# Patient Record
Sex: Male | Born: 1970 | Race: Black or African American | Hispanic: No | Marital: Single | Smoking: Current every day smoker
Health system: Southern US, Community
[De-identification: ages and names within clinical notes are randomized; demographics above are authoritative.]

## PROBLEM LIST (undated history)

## (undated) DIAGNOSIS — IMO0001 Reserved for inherently not codable concepts without codable children: Secondary | ICD-10-CM

## (undated) DIAGNOSIS — I219 Acute myocardial infarction, unspecified: Secondary | ICD-10-CM

## (undated) DIAGNOSIS — G4730 Sleep apnea, unspecified: Secondary | ICD-10-CM

## (undated) DIAGNOSIS — M1990 Unspecified osteoarthritis, unspecified site: Secondary | ICD-10-CM

## (undated) DIAGNOSIS — R519 Headache, unspecified: Secondary | ICD-10-CM

## (undated) DIAGNOSIS — E7800 Pure hypercholesterolemia, unspecified: Secondary | ICD-10-CM

## (undated) DIAGNOSIS — R51 Headache: Secondary | ICD-10-CM

## (undated) DIAGNOSIS — I10 Essential (primary) hypertension: Secondary | ICD-10-CM

## (undated) DIAGNOSIS — I509 Heart failure, unspecified: Secondary | ICD-10-CM

---

## 2000-03-26 ENCOUNTER — Emergency Department (HOSPITAL_COMMUNITY): Admission: EM | Admit: 2000-03-26 | Discharge: 2000-03-26 | Payer: Self-pay

## 2013-10-17 ENCOUNTER — Encounter (HOSPITAL_COMMUNITY): Payer: Self-pay | Admitting: Emergency Medicine

## 2013-10-17 ENCOUNTER — Emergency Department (HOSPITAL_COMMUNITY)
Admission: EM | Admit: 2013-10-17 | Discharge: 2013-10-17 | Disposition: A | Discharge status: Home / Self Care | Payer: BC Managed Care – PPO | Attending: Emergency Medicine | Admitting: Emergency Medicine

## 2013-10-17 DIAGNOSIS — M129 Arthropathy, unspecified: Secondary | ICD-10-CM | POA: Insufficient documentation

## 2013-10-17 DIAGNOSIS — Z791 Long term (current) use of non-steroidal anti-inflammatories (NSAID): Secondary | ICD-10-CM | POA: Insufficient documentation

## 2013-10-17 DIAGNOSIS — R55 Syncope and collapse: Secondary | ICD-10-CM | POA: Insufficient documentation

## 2013-10-17 DIAGNOSIS — I10 Essential (primary) hypertension: Secondary | ICD-10-CM

## 2013-10-17 DIAGNOSIS — Z862 Personal history of diseases of the blood and blood-forming organs and certain disorders involving the immune mechanism: Secondary | ICD-10-CM | POA: Insufficient documentation

## 2013-10-17 DIAGNOSIS — Z8639 Personal history of other endocrine, nutritional and metabolic disease: Secondary | ICD-10-CM | POA: Insufficient documentation

## 2013-10-17 DIAGNOSIS — Z79899 Other long term (current) drug therapy: Secondary | ICD-10-CM | POA: Insufficient documentation

## 2013-10-17 HISTORY — DX: Unspecified osteoarthritis, unspecified site: M19.90

## 2013-10-17 HISTORY — DX: Essential (primary) hypertension: I10

## 2013-10-17 HISTORY — DX: Pure hypercholesterolemia, unspecified: E78.00

## 2013-10-17 LAB — BASIC METABOLIC PANEL
Anion gap: 15 (ref 5–15)
BUN: 18 mg/dL (ref 6–23)
CO2: 27 mEq/L (ref 19–32)
Calcium: 9.5 mg/dL (ref 8.4–10.5)
Chloride: 101 mEq/L (ref 96–112)
Creatinine, Ser: 0.96 mg/dL (ref 0.50–1.35)
GFR calc non Af Amer: >90 mL/min (ref >90)
Glucose, Bld: 103 mg/dL — ABNORMAL HIGH (ref 70–99)
Potassium: 3.7 mEq/L (ref 3.7–5.3)
Sodium: 143 mEq/L (ref 137–147)

## 2013-10-17 LAB — TROPONIN I
Troponin I: <0.3 ng/mL (ref <0.30)
Troponin I: <0.3 ng/mL (ref <0.30)

## 2013-10-17 LAB — CBC
HEMATOCRIT: 44.7 % (ref 39.0–52.0)
Hemoglobin: 14.8 g/dL (ref 13.0–17.0)
MCH: 27.2 pg (ref 26.0–34.0)
MCHC: 33.1 g/dL (ref 30.0–36.0)
MCV: 82 fL (ref 78.0–100.0)
Platelets: 218 10*3/uL (ref 150–400)
RBC: 5.45 MIL/uL (ref 4.22–5.81)
RDW: 15.1 % (ref 11.5–15.5)
WBC: 8.2 10*3/uL (ref 4.0–10.5)

## 2013-10-17 MED ORDER — HYDROCHLOROTHIAZIDE 25 MG PO TABS: 25.0000 mg | ORAL_TABLET | Freq: Every day | ORAL | Status: DC

## 2013-10-17 MED ORDER — BENAZEPRIL HCL 20 MG PO TABS: 20.0000 mg | ORAL_TABLET | Freq: Every day | ORAL | Status: DC

## 2013-10-17 MED ORDER — AMLODIPINE BESYLATE 5 MG PO TABS
5.0000 mg | ORAL_TABLET | Freq: Once | ORAL | Status: AC
  Administered 2013-10-17: 5 mg via ORAL
  Filled 2013-10-17: qty 1

## 2013-10-17 MED ORDER — LABETALOL HCL 5 MG/ML IV SOLN
10.0000 mg | Freq: Once | INTRAVENOUS | Status: AC
  Administered 2013-10-17: 10 mg via INTRAVENOUS
  Filled 2013-10-17: qty 4

## 2013-10-17 MED ORDER — AMLODIPINE BESYLATE 10 MG PO TABS: 10.0000 mg | ORAL_TABLET | Freq: Every day | ORAL | Status: DC

## 2013-10-17 MED ORDER — HYDROCHLOROTHIAZIDE 25 MG PO TABS
25.0000 mg | ORAL_TABLET | Freq: Every day | ORAL | Status: DC
  Administered 2013-10-17: 25 mg via ORAL
  Filled 2013-10-17: qty 1

## 2013-10-17 MED ORDER — OXYCODONE-ACETAMINOPHEN 5-325 MG PO TABS
1.0000 | ORAL_TABLET | Freq: Once | ORAL | Status: AC
  Administered 2013-10-17: 1 via ORAL
  Filled 2013-10-17: qty 1

## 2013-10-17 NOTE — ED Notes (Signed)
Gave pt water 

## 2013-10-17 NOTE — ED Provider Notes (Signed)
CSN: [id number]     Arrival date & time 10/17/13  [id number] History   First MD Initiated Contact with Patient 10/17/13 [phone]     Chief Complaint  Patient presents with  . 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  He awoke and had coffee but did not have anything to eat.  He states this is typical for him.  He states he began feeling lightheaded and the next thing he knew he woke up and before standing around him.  As reported that he was slightly diaphoretic.  He denied chest pain shortness of breath.  He reported some lightheadedness.  He was found to have a blood pressure of 207 systolic.  His heart rate was 96 and regular per EMS.  He reports no chest pain or shortness of breath at this time.  No preceding palpitations.  He states he stands for long periods of time at the counter.  He states he always stands with his legs locked secondary to arthritis pain in his knees.  He has a history of hypertension and is taking blood pressure medication for this but he does not remember which blood pressure medications he takes.  He gives me a list of 4 and states he takes some of these.  No history of cardiac issues.  No recent illness.  No recent diarrhea.  Denies melena hematochezia   Past Medical History  Diagnosis Date  . Hypertension   . Arthritis   . Hypercholesteremia    History reviewed. No pertinent past surgical history. History reviewed. No pertinent family history. History  Substance Use Topics  . Smoking status: Never Smoker   . Smokeless tobacco: Not on file  . Alcohol Use: Yes     Comment: occa    Review of Systems  All other systems reviewed and are negative.     Allergies  Review of patient's allergies indicates no known allergies.  Home Medications   Prior to Admission medications   Medication Sig Start Date End Date Taking? Authorizing Provider  celecoxib (CELEBREX) 200 MG capsule Take 200 mg by mouth daily.    Yes Historical Provider, MD  ibuprofen (ADVIL,MOTRIN) 200 MG tablet Take 800 mg by mouth 2 (two) times daily as needed for mild pain.   Yes Historical Provider, MD  Multiple Vitamins-Minerals (MULTIVITAMIN PO) Take 1 tablet by mouth daily.   Yes Historical Provider, MD  traMADol (ULTRAM) 50 MG tablet Take 50 mg by mouth every 6 (six) hours as needed for moderate pain.   Yes Historical Provider, MD  amLODipine (NORVASC) 10 MG tablet Take 1 tablet (10 mg total) by mouth daily. 10/17/13   Lyanne CoKevin M Sammie Schermerhorn, MD  benazepril (LOTENSIN) 20 MG tablet Take 1 tablet (20 mg total) by mouth daily. 10/17/13   Lyanne CoKevin M Alnita Aybar, MD  hydrochlorothiazide (HYDRODIURIL) 25 MG tablet Take 1 tablet (25 mg total) by mouth daily. 10/17/13   Lyanne CoKevin M Kemonie Cutillo, MD   BP 178/127  Pulse 89  Temp(Src) 98.1 F (36.7 C) (Oral)  Resp 23  Ht 5\' 11"  (1.803 m)  Wt 310 lb (140.615 kg)  BMI 43.26 kg/m2  SpO2 96% Physical Exam  Nursing note and vitals reviewed. Constitutional: He is oriented to person, place, and time. He appears well-developed and well-nourished.  HENT:  Head: Normocephalic and atraumatic.  Eyes: EOM are normal.  Neck: Normal range of motion.  Cardiovascular: Normal rate, regular rhythm, normal heart sounds and intact  distal pulses.   Pulmonary/Chest: Effort normal and breath sounds normal. No respiratory distress.  Abdominal: Soft. He exhibits no distension. There is no tenderness.  Musculoskeletal: Normal range of motion.  Neurological: He is alert and oriented to person, place, and time.  Skin: Skin is warm and dry.  Psychiatric: He has a normal mood and affect. Judgment normal.    ED Course  Procedures (including critical care time) Labs Review Labs Reviewed  BASIC METABOLIC PANEL - Abnormal; Notable for the following:    Glucose, Bld 103 (*)    All other components within normal limits  CBC  TROPONIN I  TROPONIN I    Imaging Review No results found.   EKG Interpretation   Date/Time:  Tuesday  October 17 2013 12:08:19 EDT Ventricular Rate:  88 PR Interval:  157 QRS Duration: 109 QT Interval:  418 QTC Calculation: 506 R Axis:   -49 Text Interpretation:  Sinus rhythm Probable left atrial enlargement  Incomplete RBBB and LAFB RSR' in V1 or V2, probably normal variant Left  ventricular hypertrophy with st channges consistent with LVH nd repol  abnormality No old tracing to compare Confirmed by Helina Hullum  MD, Caryn BeeKEVIN  ([id number]) on 10/17/2013 3:34:54 PM      MDM   Final diagnoses:  Syncope, unspecified syncope type  Essential hypertension    Hypertensive on arrival.  Initially I tried to lie this compounds on but it did not.  Patient was treated with oral and IV medication with improvement in his blood pressure.  He continues to feel fine at this time unlikely to home.  Troponin x2 negative.  EKG x2 without acute injury pattern.  He does have nonspecific repolarization abnormalities.  He has no prior EKG these appear to be recall abnormalities in with 2 negative troponins unchanged EKG from his one earlier in the ER visit I do not believe this is an acute coronary event.  Discharge home on Norvasc, Lotensin, HCTZ.  ECT followup is scheduled for 2 weeks from now.  The patient will record his blood pressures twice daily until then.  He understands to return to the ER for new or worsening symptoms    Lyanne CoKevin M Zophia Marrone, MD 10/17/13 1710

## 2013-10-17 NOTE — ED Notes (Signed)
Pt unhooked so he could amb to the bathroom for a BM.

## 2013-10-17 NOTE — ED Notes (Signed)
Per [facility], pt was at work and about 0815 started having a HA and became dizzy and diaphoretic. Upon arrival of EMS, pt was warm and dry, denied pain and was still a little lightheaded. 207 systolic. Unable to get diastolic reading. HR 96 and regular.

## 2013-10-17 NOTE — ED Notes (Addendum)
NAD noted. No pain noted. IV taken out. Pt given discharge instructions. All questions answered. Prescriptions reviewed. Pt ambulatory on discharge.

## 2013-10-17 NOTE — Discharge Instructions (Signed)
Hypertension Hypertension, commonly called high blood pressure, is when the force of blood pumping through your arteries is too strong. Your arteries are the blood vessels that carry blood from your heart throughout your body. A blood pressure reading consists of a higher number over a lower number, such as 110/72. The higher number (systolic) is the pressure inside your arteries when your heart pumps. The lower number (diastolic) is the pressure inside your arteries when your heart relaxes. Ideally you want your blood pressure below 120/80. Hypertension forces your heart to work harder to pump blood. Your arteries may become narrow or stiff. Having hypertension puts you at risk for heart disease, stroke, and other problems.  RISK FACTORS Some risk factors for high blood pressure are controllable. Others are not.  Risk factors you cannot control include:   Race. You may be at higher risk if you are African American.  Age. Risk increases with age.  Gender. Men are at higher risk than women before age 45 years. After age 65, women are at higher risk than men. Risk factors you can control include:  Not getting enough exercise or physical activity.  Being overweight.  Getting too much fat, sugar, calories, or salt in your diet.  Drinking too much alcohol. SIGNS AND SYMPTOMS Hypertension does not usually cause signs or symptoms. Extremely high blood pressure (hypertensive crisis) may cause headache, anxiety, shortness of breath, and nosebleed. DIAGNOSIS  To check if you have hypertension, your health care provider will measure your blood pressure while you are seated, with your arm held at the level of your heart. It should be measured at least twice using the same arm. Certain conditions can cause a difference in blood pressure between your right and left arms. A blood pressure reading that is higher than normal on one occasion does not mean that you need treatment. If one blood pressure reading  is high, ask your health care provider about having it checked again. TREATMENT  Treating high blood pressure includes making lifestyle changes and possibly taking medication. Living a healthy lifestyle can help lower high blood pressure. You may need to change some of your habits. Lifestyle changes may include:  Following the DASH diet. This diet is high in fruits, vegetables, and whole grains. It is low in salt, red meat, and added sugars.  Getting at least 2 1/2 hours of brisk physical activity every week.  Losing weight if necessary.  Not smoking.  Limiting alcoholic beverages.  Learning ways to reduce stress. If lifestyle changes are not enough to get your blood pressure under control, your health care provider may prescribe medicine. You may need to take more than one. Work closely with your health care provider to understand the risks and benefits. HOME CARE INSTRUCTIONS  Have your blood pressure rechecked as directed by your health care provider.   Only take medicine as directed by your health care provider. Follow the directions carefully. Blood pressure medicines must be taken as prescribed. The medicine does not work as well when you skip doses. Skipping doses also puts you at risk for problems.   Do not smoke.   Monitor your blood pressure at home as directed by your health care provider. SEEK MEDICAL CARE IF:   You think you are having a reaction to medicines taken.  You have recurrent headaches or feel dizzy.  You have swelling in your ankles.  You have trouble with your vision. SEEK IMMEDIATE MEDICAL CARE IF:  You develop a severe headache or   confusion.  You have unusual weakness, numbness, or feel faint.  You have severe chest or abdominal pain.  You vomit repeatedly.  You have trouble breathing. MAKE SURE YOU:   Understand these instructions.  Will watch your condition.  Will get help right away if you are not doing well or get  worse. Document Released: 03/30/2005 Document Revised: 04/04/2013 Document Reviewed: 01/20/2013 ExitCare Patient Information 2015 [employer]. This information is not intended to replace advice given to you by your health care provider. Make sure you discuss any questions you have with your health care provider. DASH Eating Plan DASH stands for "Dietary Approaches to Stop Hypertension." The DASH eating plan is a healthy eating plan that has been shown to reduce high blood pressure (hypertension). Additional health benefits may include reducing the risk of type 2 diabetes mellitus, heart disease, and stroke. The DASH eating plan may also help with weight loss. WHAT DO I NEED TO KNOW ABOUT THE DASH EATING PLAN? For the DASH eating plan, you will follow these general guidelines:  Choose foods with a percent daily value for sodium of less than 5% (as listed on the food label).  Use salt-free seasonings or herbs instead of table salt or sea salt.  Check with your health care provider or pharmacist before using salt substitutes.  Eat lower-sodium products, often labeled as "lower sodium" or "no salt added."  Eat fresh foods.  Eat more vegetables, fruits, and low-fat dairy products.  Choose whole grains. Look for the word "whole" as the first word in the ingredient list.  Choose fish and skinless chicken or turkey more often than red meat. Limit fish, poultry, and meat to 6 oz (170 g) each day.  Limit sweets, desserts, sugars, and sugary drinks.  Choose heart-healthy fats.  Limit cheese to 1 oz (28 g) per day.  Eat more home-cooked food and less restaurant, buffet, and fast food.  Limit fried foods.  Cook foods using methods other than frying.  Limit canned vegetables. If you do use them, rinse them well to decrease the sodium.  When eating at a restaurant, ask that your food be prepared with less salt, or no salt if possible. WHAT FOODS CAN I EAT? Seek help from a dietitian for  individual calorie needs. Grains Whole grain or whole wheat bread. Brown rice. Whole grain or whole wheat pasta. Quinoa, bulgur, and whole grain cereals. Low-sodium cereals. Corn or whole wheat flour tortillas. Whole grain cornbread. Whole grain crackers. Low-sodium crackers. Vegetables Fresh or frozen vegetables (raw, steamed, roasted, or grilled). Low-sodium or reduced-sodium tomato and vegetable juices. Low-sodium or reduced-sodium tomato sauce and paste. Low-sodium or reduced-sodium canned vegetables.  Fruits All fresh, canned (in natural juice), or frozen fruits. Meat and Other Protein Products Ground beef (85% or leaner), grass-fed beef, or beef trimmed of fat. Skinless chicken or turkey. Ground chicken or turkey. Pork trimmed of fat. All fish and seafood. Eggs. Dried beans, peas, or lentils. Unsalted nuts and seeds. Unsalted canned beans. Dairy Low-fat dairy products, such as skim or 1% milk, 2% or reduced-fat cheeses, low-fat ricotta or cottage cheese, or plain low-fat yogurt. Low-sodium or reduced-sodium cheeses. Fats and Oils Tub margarines without trans fats. Light or reduced-fat mayonnaise and salad dressings (reduced sodium). Avocado. Safflower, olive, or canola oils. Natural peanut or almond butter. Other Unsalted popcorn and pretzels. The items listed above may not be a complete list of recommended foods or beverages. Contact your dietitian for more options. WHAT FOODS ARE NOT RECOMMENDED? Grains   White bread. White pasta. White rice. Refined cornbread. Bagels and croissants. Crackers that contain trans fat. Vegetables Creamed or fried vegetables. Vegetables in a cheese sauce. Regular canned vegetables. Regular canned tomato sauce and paste. Regular tomato and vegetable juices. Fruits Dried fruits. Canned fruit in light or heavy syrup. Fruit juice. Meat and Other Protein Products Fatty cuts of meat. Ribs, chicken wings, bacon, sausage, bologna, salami, chitterlings, fatback, hot  dogs, bratwurst, and packaged luncheon meats. Salted nuts and seeds. Canned beans with salt. Dairy Whole or 2% milk, cream, half-and-half, and cream cheese. Whole-fat or sweetened yogurt. Full-fat cheeses or blue cheese. Nondairy creamers and whipped toppings. Processed cheese, cheese spreads, or cheese curds. Condiments Onion and garlic salt, seasoned salt, table salt, and sea salt. Canned and packaged gravies. Worcestershire sauce. Tartar sauce. Barbecue sauce. Teriyaki sauce. Soy sauce, including reduced sodium. Steak sauce. Fish sauce. Oyster sauce. Cocktail sauce. Horseradish. Ketchup and mustard. Meat flavorings and tenderizers. Bouillon cubes. Hot sauce. Tabasco sauce. Marinades. Taco seasonings. Relishes. Fats and Oils Butter, stick margarine, lard, shortening, ghee, and bacon fat. Coconut, palm kernel, or palm oils. Regular salad dressings. Other Pickles and olives. Salted popcorn and pretzels. The items listed above may not be a complete list of foods and beverages to avoid. Contact your dietitian for more information. WHERE CAN I FIND MORE INFORMATION? National Heart, Lung, and Blood Institute: www.nhlbi.nih.gov/health/health-topics/topics/dash/ Document Released: 03/19/2011 Document Revised: 04/04/2013 Document Reviewed: 02/01/2013 ExitCare Patient Information 2015 [employer]. This information is not intended to replace advice given to you by your health care provider. Make sure you discuss any questions you have with your health care provider.  

## 2013-10-17 NOTE — ED Notes (Signed)
Pt denies HA now. Pt states he feels pretty much back to normal. He feels slightly SOB but states he has had that before when he had a panic attack. Pt is neurally intact. Pt also states he coworkers told him he passed out briefly in a chair and was diaphoretic. This happened after his HA started and he was dizzy. Pt does not remember passing out.

## 2014-03-25 ENCOUNTER — Encounter (HOSPITAL_COMMUNITY): Payer: Self-pay | Admitting: Emergency Medicine

## 2014-03-25 ENCOUNTER — Emergency Department (INDEPENDENT_AMBULATORY_CARE_PROVIDER_SITE_OTHER)
Admission: EM | Admit: 2014-03-25 | Discharge: 2014-03-25 | Disposition: A | Discharge status: Home / Self Care | Payer: Self-pay | Source: Home / Self Care | Attending: Family Medicine | Admitting: Family Medicine

## 2014-03-25 DIAGNOSIS — I10 Essential (primary) hypertension: Secondary | ICD-10-CM

## 2014-03-25 LAB — POCT I-STAT, CHEM 8
BUN: 15 mg/dL (ref 6–23)
Calcium, Ion: 1.15 mmol/L (ref 1.12–1.23)
Chloride: 100 mEq/L (ref 96–112)
Creatinine, Ser: 1.2 mg/dL (ref 0.50–1.35)
GLUCOSE: 89 mg/dL (ref 70–99)
HEMATOCRIT: 51 % (ref 39.0–52.0)
HEMOGLOBIN: 17.3 g/dL — AB (ref 13.0–17.0)
POTASSIUM: 3.4 meq/L — AB (ref 3.7–5.3)
Sodium: 141 mEq/L (ref 137–147)
TCO2: 28 mmol/L (ref 0–100)

## 2014-03-25 MED ORDER — AMLODIPINE BESYLATE 10 MG PO TABS: 10.0000 mg | ORAL_TABLET | Freq: Every day | ORAL | Status: DC

## 2014-03-25 MED ORDER — LOSARTAN POTASSIUM 100 MG PO TABS: 100.0000 mg | ORAL_TABLET | Freq: Every day | ORAL | Status: DC

## 2014-03-25 NOTE — Discharge Instructions (Signed)
Take medicine as prescribed, see your doctor in 1 week for recheck.

## 2014-03-25 NOTE — ED Notes (Signed)
Pt states that he has been out of HTN medication since September/october. Pt states that he lost his job and has no [employer], [employer] insurance

## 2014-03-25 NOTE — ED Provider Notes (Signed)
CSN: [id number]     Arrival date & time 03/25/14  1757 History   First MD Initiated Contact with Patient 03/25/14 1832     Chief Complaint  Patient presents with  . Medication Refill   (Consider location/radiation/quality/duration/timing/severity/associated sxs/prior Treatment) Patient is a 43 y.o. male presenting with hypertension. The history is provided by the patient.  Hypertension This is a chronic problem. Episode onset: out of bp meds since end of sept, h/o mi, no job , no money. The problem has not changed since onset.Pertinent negatives include no chest pain, no abdominal pain and no shortness of breath.    Past Medical History  Diagnosis Date  . Hypertension   . Arthritis   . Hypercholesteremia    History reviewed. No pertinent past surgical history. History reviewed. No pertinent family history. History  Substance Use Topics  . Smoking status: Never Smoker   . Smokeless tobacco: Not on file  . Alcohol Use: Yes     Comment: occa    Review of Systems  Constitutional: Negative.   Respiratory: Negative.  Negative for shortness of breath.   Cardiovascular: Positive for leg swelling. Negative for chest pain and palpitations.  Gastrointestinal: Negative.  Negative for abdominal pain.    Allergies  Review of patient's allergies indicates no known allergies.  Home Medications   Prior to Admission medications   Medication Sig Start Date End Date Taking? Authorizing Provider  amLODipine (NORVASC) 10 MG tablet Take 1 tablet (10 mg total) by mouth daily. 10/17/13   Lyanne CoKevin M Campos, MD  amLODipine (NORVASC) 10 MG tablet Take 1 tablet (10 mg total) by mouth daily. 03/25/14   Linna HoffJames D Zyrion Coey, MD  benazepril (LOTENSIN) 20 MG tablet Take 1 tablet (20 mg total) by mouth daily. 10/17/13   Lyanne CoKevin M Campos, MD  celecoxib (CELEBREX) 200 MG capsule Take 200 mg by mouth daily.    Historical Provider, MD  hydrochlorothiazide (HYDRODIURIL) 25 MG tablet Take 1 tablet (25 mg total) by mouth  daily. 10/17/13   Lyanne CoKevin M Campos, MD  ibuprofen (ADVIL,MOTRIN) 200 MG tablet Take 800 mg by mouth 2 (two) times daily as needed for mild pain.    Historical Provider, MD  losartan (COZAAR) 100 MG tablet Take 1 tablet (100 mg total) by mouth daily. 03/25/14   Linna HoffJames D Renji Berwick, MD  Multiple Vitamins-Minerals (MULTIVITAMIN PO) Take 1 tablet by mouth daily.    Historical Provider, MD  traMADol (ULTRAM) 50 MG tablet Take 50 mg by mouth every 6 (six) hours as needed for moderate pain.    Historical Provider, MD   BP 180/115 mmHg  Pulse 92  Temp(Src) 99.9 F (37.7 C) (Oral)  Resp 18  SpO2 95% Physical Exam  Constitutional: He is oriented to person, place, and time. He appears well-developed and well-nourished. No distress.  Neck: Normal range of motion. Neck supple.  Cardiovascular: Normal rate, regular rhythm, normal heart sounds and intact distal pulses.   Pulmonary/Chest: Effort normal and breath sounds normal. No respiratory distress.  Musculoskeletal: He exhibits edema.  Neurological: He is alert and oriented to person, place, and time.  Skin: Skin is warm and dry.  Nursing note and vitals reviewed.   ED Course  Procedures (including critical care time) Labs Review Labs Reviewed  POCT I-STAT, CHEM 8 - Abnormal; Notable for the following:    Potassium 3.4 (*)    Hemoglobin 17.3 (*)    All other components within normal limits   i-stat as noted. Imaging Review No results found.  MDM   1. Essential hypertension        Linna HoffJames D Marshella Tello, MD 03/26/14 [phone]

## 2014-06-04 ENCOUNTER — Emergency Department (HOSPITAL_COMMUNITY): Payer: Self-pay

## 2014-06-04 ENCOUNTER — Encounter (HOSPITAL_COMMUNITY): Payer: Self-pay | Admitting: *Deleted

## 2014-06-04 ENCOUNTER — Inpatient Hospital Stay (HOSPITAL_COMMUNITY)
Admission: EM | Admit: 2014-06-04 | Discharge: 2014-06-06 | DRG: 292 | Disposition: A | Discharge status: Home / Self Care | Payer: Self-pay | Attending: Internal Medicine | Admitting: Internal Medicine

## 2014-06-04 ENCOUNTER — Emergency Department (INDEPENDENT_AMBULATORY_CARE_PROVIDER_SITE_OTHER)
Admission: EM | Admit: 2014-06-04 | Discharge: 2014-06-04 | Disposition: A | Discharge status: Nursing Home (Includes ICF) | Payer: Self-pay | Source: Home / Self Care | Attending: Emergency Medicine | Admitting: Emergency Medicine

## 2014-06-04 DIAGNOSIS — R600 Localized edema: Secondary | ICD-10-CM | POA: Diagnosis present

## 2014-06-04 DIAGNOSIS — Z8249 Family history of ischemic heart disease and other diseases of the circulatory system: Secondary | ICD-10-CM

## 2014-06-04 DIAGNOSIS — I2489 Other forms of acute ischemic heart disease: Secondary | ICD-10-CM | POA: Insufficient documentation

## 2014-06-04 DIAGNOSIS — E780 Pure hypercholesterolemia: Secondary | ICD-10-CM | POA: Diagnosis present

## 2014-06-04 DIAGNOSIS — I43 Cardiomyopathy in diseases classified elsewhere: Secondary | ICD-10-CM | POA: Diagnosis present

## 2014-06-04 DIAGNOSIS — I160 Hypertensive urgency: Secondary | ICD-10-CM | POA: Diagnosis present

## 2014-06-04 DIAGNOSIS — R079 Chest pain, unspecified: Secondary | ICD-10-CM

## 2014-06-04 DIAGNOSIS — R0602 Shortness of breath: Secondary | ICD-10-CM

## 2014-06-04 DIAGNOSIS — Z87891 Personal history of nicotine dependence: Secondary | ICD-10-CM

## 2014-06-04 DIAGNOSIS — I10 Essential (primary) hypertension: Secondary | ICD-10-CM

## 2014-06-04 DIAGNOSIS — R0609 Other forms of dyspnea: Secondary | ICD-10-CM | POA: Diagnosis present

## 2014-06-04 DIAGNOSIS — I110 Hypertensive heart disease with heart failure: Principal | ICD-10-CM | POA: Diagnosis present

## 2014-06-04 DIAGNOSIS — R7989 Other specified abnormal findings of blood chemistry: Secondary | ICD-10-CM | POA: Diagnosis present

## 2014-06-04 DIAGNOSIS — E669 Obesity, unspecified: Secondary | ICD-10-CM | POA: Diagnosis present

## 2014-06-04 DIAGNOSIS — Z79899 Other long term (current) drug therapy: Secondary | ICD-10-CM

## 2014-06-04 DIAGNOSIS — I5043 Acute on chronic combined systolic (congestive) and diastolic (congestive) heart failure: Secondary | ICD-10-CM | POA: Diagnosis present

## 2014-06-04 DIAGNOSIS — E876 Hypokalemia: Secondary | ICD-10-CM | POA: Diagnosis present

## 2014-06-04 DIAGNOSIS — I248 Other forms of acute ischemic heart disease: Secondary | ICD-10-CM | POA: Diagnosis present

## 2014-06-04 DIAGNOSIS — R778 Other specified abnormalities of plasma proteins: Secondary | ICD-10-CM | POA: Diagnosis present

## 2014-06-04 DIAGNOSIS — I509 Heart failure, unspecified: Secondary | ICD-10-CM

## 2014-06-04 DIAGNOSIS — I119 Hypertensive heart disease without heart failure: Secondary | ICD-10-CM | POA: Insufficient documentation

## 2014-06-04 DIAGNOSIS — R9431 Abnormal electrocardiogram [ECG] [EKG]: Secondary | ICD-10-CM

## 2014-06-04 DIAGNOSIS — M1990 Unspecified osteoarthritis, unspecified site: Secondary | ICD-10-CM | POA: Diagnosis present

## 2014-06-04 HISTORY — DX: Sleep apnea, unspecified: G47.30

## 2014-06-04 HISTORY — DX: Headache, unspecified: R51.9

## 2014-06-04 HISTORY — DX: Heart failure, unspecified: I50.9

## 2014-06-04 HISTORY — DX: Acute myocardial infarction, unspecified: I21.9

## 2014-06-04 HISTORY — DX: Reserved for inherently not codable concepts without codable children: IMO0001

## 2014-06-04 HISTORY — DX: Headache: R51

## 2014-06-04 LAB — BASIC METABOLIC PANEL
Anion gap: 11 (ref 5–15)
BUN: 13 mg/dL (ref 6–23)
CHLORIDE: 105 mmol/L (ref 96–112)
CO2: 25 mmol/L (ref 19–32)
Calcium: 8.8 mg/dL (ref 8.4–10.5)
Creatinine, Ser: 0.89 mg/dL (ref 0.50–1.35)
GFR calc Af Amer: >90 mL/min (ref >90)
GFR calc non Af Amer: >90 mL/min (ref >90)
GLUCOSE: 116 mg/dL — AB (ref 70–99)
Potassium: 3.1 mmol/L — ABNORMAL LOW (ref 3.5–5.1)
SODIUM: 141 mmol/L (ref 135–145)

## 2014-06-04 LAB — PROTIME-INR
INR: 1.07 (ref 0.00–1.49)
PROTHROMBIN TIME: 14 s (ref 11.6–15.2)

## 2014-06-04 LAB — TROPONIN I
TROPONIN I: 0.06 ng/mL — AB (ref <0.031)
TROPONIN I: 0.06 ng/mL — AB (ref <0.031)

## 2014-06-04 LAB — CBC WITH DIFFERENTIAL/PLATELET
Basophils Absolute: 0 10*3/uL (ref 0.0–0.1)
Basophils Relative: 0 % (ref 0–1)
Eosinophils Absolute: 0.1 10*3/uL (ref 0.0–0.7)
Eosinophils Relative: 1 % (ref 0–5)
HCT: 41.2 % (ref 39.0–52.0)
Hemoglobin: 13.4 g/dL (ref 13.0–17.0)
Lymphocytes Relative: 19 % (ref 12–46)
Lymphs Abs: 1.4 10*3/uL (ref 0.7–4.0)
MCH: 25.8 pg — ABNORMAL LOW (ref 26.0–34.0)
MCHC: 32.5 g/dL (ref 30.0–36.0)
MCV: 79.4 fL (ref 78.0–100.0)
Monocytes Absolute: 0.5 10*3/uL (ref 0.1–1.0)
Monocytes Relative: 6 % (ref 3–12)
NEUTROS ABS: 5.2 10*3/uL (ref 1.7–7.7)
Neutrophils Relative %: 74 % (ref 43–77)
PLATELETS: 241 10*3/uL (ref 150–400)
RBC: 5.19 MIL/uL (ref 4.22–5.81)
RDW: 15.3 % (ref 11.5–15.5)
WBC: 7.1 10*3/uL (ref 4.0–10.5)

## 2014-06-04 LAB — BRAIN NATRIURETIC PEPTIDE: B Natriuretic Peptide: 409.2 pg/mL — ABNORMAL HIGH (ref 0.0–100.0)

## 2014-06-04 LAB — TSH: TSH: 1.521 u[IU]/mL (ref 0.350–4.500)

## 2014-06-04 MED ORDER — HYDROCODONE-ACETAMINOPHEN 5-325 MG PO TABS: 1.0000 | ORAL_TABLET | ORAL | Status: DC | PRN

## 2014-06-04 MED ORDER — BENAZEPRIL HCL 20 MG PO TABS
20.0000 mg | ORAL_TABLET | Freq: Every day | ORAL | Status: DC
  Administered 2014-06-04: 20 mg via ORAL
  Filled 2014-06-04: qty 1

## 2014-06-04 MED ORDER — CARVEDILOL 3.125 MG PO TABS
3.1250 mg | ORAL_TABLET | Freq: Two times a day (BID) | ORAL | Status: DC
  Administered 2014-06-04 – 2014-06-05 (×3): 3.125 mg via ORAL
  Filled 2014-06-04 (×6): qty 1

## 2014-06-04 MED ORDER — HEPARIN SODIUM (PORCINE) 5000 UNIT/ML IJ SOLN
5000.0000 [IU] | Freq: Three times a day (TID) | INTRAMUSCULAR | Status: DC
  Administered 2014-06-04 – 2014-06-06 (×5): 5000 [IU] via SUBCUTANEOUS
  Filled 2014-06-04 (×8): qty 1

## 2014-06-04 MED ORDER — LABETALOL HCL 5 MG/ML IV SOLN
20.0000 mg | Freq: Once | INTRAVENOUS | Status: AC
  Administered 2014-06-04: 20 mg via INTRAVENOUS
  Filled 2014-06-04: qty 4

## 2014-06-04 MED ORDER — HYDROCHLOROTHIAZIDE 25 MG PO TABS: 25.0000 mg | ORAL_TABLET | Freq: Once | ORAL | Status: DC

## 2014-06-04 MED ORDER — AMLODIPINE BESYLATE 10 MG PO TABS
10.0000 mg | ORAL_TABLET | Freq: Every day | ORAL | Status: DC
  Administered 2014-06-05: 10 mg via ORAL
  Filled 2014-06-04 (×2): qty 1

## 2014-06-04 MED ORDER — HYDRALAZINE HCL 20 MG/ML IJ SOLN
10.0000 mg | Freq: Four times a day (QID) | INTRAMUSCULAR | Status: DC | PRN
  Administered 2014-06-05: 10 mg via INTRAVENOUS
  Filled 2014-06-04 (×2): qty 1

## 2014-06-04 MED ORDER — POTASSIUM CHLORIDE CRYS ER 20 MEQ PO TBCR
60.0000 meq | EXTENDED_RELEASE_TABLET | Freq: Four times a day (QID) | ORAL | Status: AC
  Administered 2014-06-05: 60 meq via ORAL
  Filled 2014-06-04: qty 3

## 2014-06-04 MED ORDER — ACETAMINOPHEN 650 MG RE SUPP: 650.0000 mg | Freq: Four times a day (QID) | RECTAL | Status: DC | PRN

## 2014-06-04 MED ORDER — MORPHINE SULFATE 4 MG/ML IJ SOLN: 4.0000 mg | INTRAMUSCULAR | Status: DC | PRN

## 2014-06-04 MED ORDER — AMLODIPINE BESYLATE 5 MG PO TABS
10.0000 mg | ORAL_TABLET | Freq: Once | ORAL | Status: AC
  Administered 2014-06-04: 10 mg via ORAL
  Filled 2014-06-04: qty 2

## 2014-06-04 MED ORDER — FUROSEMIDE 10 MG/ML IJ SOLN
40.0000 mg | Freq: Two times a day (BID) | INTRAMUSCULAR | Status: DC
  Administered 2014-06-05 – 2014-06-06 (×3): 40 mg via INTRAVENOUS
  Filled 2014-06-04 (×5): qty 4

## 2014-06-04 MED ORDER — SODIUM CHLORIDE 0.9 % IJ SOLN
3.0000 mL | Freq: Two times a day (BID) | INTRAMUSCULAR | Status: DC
  Administered 2014-06-04 – 2014-06-06 (×4): 3 mL via INTRAVENOUS

## 2014-06-04 MED ORDER — MORPHINE SULFATE 2 MG/ML IJ SOLN: 1.0000 mg | INTRAMUSCULAR | Status: DC | PRN

## 2014-06-04 MED ORDER — ACETAMINOPHEN 325 MG PO TABS
650.0000 mg | ORAL_TABLET | Freq: Four times a day (QID) | ORAL | Status: DC | PRN
  Administered 2014-06-05: 650 mg via ORAL
  Filled 2014-06-04: qty 2

## 2014-06-04 MED ORDER — SODIUM CHLORIDE 0.9 % IV SOLN
Freq: Once | INTRAVENOUS | Status: AC
  Administered 2014-06-04: 11:00:00 via INTRAVENOUS

## 2014-06-04 NOTE — ED Provider Notes (Signed)
CSN: [id number]     Arrival date & time 06/04/14  1150 History   First Livingston Initiated Contact with Patient 06/04/14 1155     Chief Complaint  Patient presents with  . Hypertension  . Shortness of Breath     (Consider location/radiation/quality/duration/timing/severity/associated sxs/prior Treatment) HPI   PCP: NO PCP pt reports recently relocated from [place] ( I do see visits in the EMR from July 2015). Blood pressure 210/140, pulse 93, temperature 97.5 F (36.4 C), temperature source Oral, resp. rate 25, SpO2 97 %.  Scott Livingston is a 44 y.o.male with a significant PMH of hypertension, arthritis, hypercholesteremia  presents to the ER sent by [facility] from the Urgent Care. He reports going to the [facility] this morning to establish a PCP as he has been out of his hypertensive medications for the past week. They recommended he go to the UC. The patient has had lower extremity swelling, orthopnea, DOE, and uncontrolled hypertension. He denies any CP or SOB at rest. He takes Lotensin and Norvasc at home usually. Denies headache, neck pain, change in vision, generalized or focal weakness, confusion, CP, SOB at rest, cough, hematuria or any other associated symptoms. In Triage his BP is 210/140, in the room the most current read is 190/ 120.  Past Medical History  Diagnosis Date  . Hypertension   . Arthritis   . Hypercholesteremia    No past surgical history on file. No family history on file. History  Substance Use Topics  . Smoking status: Former Games developer  . Smokeless tobacco: Not on file  . Alcohol Use: Yes     Comment: occa    Review of Systems  10 Systems reviewed and are negative for acute change except as noted in the HPI.    Allergies  Review of patient's allergies indicates no known allergies.  Home Medications   Prior to Admission medications   Medication Sig Start Date End Date Taking? Authorizing Provider  amLODipine (NORVASC) 10 MG tablet Take 1 tablet  (10 mg total) by mouth daily. 10/17/13   Scott Livingston  amLODipine (NORVASC) 10 MG tablet Take 1 tablet (10 mg total) by mouth daily. 03/25/14   Scott Livingston  benazepril (LOTENSIN) 20 MG tablet Take 1 tablet (20 mg total) by mouth daily. 10/17/13   Scott Livingston  celecoxib (CELEBREX) 200 MG capsule Take 200 mg by mouth daily.    Historical Provider, Livingston  hydrochlorothiazide (HYDRODIURIL) 25 MG tablet Take 1 tablet (25 mg total) by mouth daily. 10/17/13   Scott Livingston  ibuprofen (ADVIL,MOTRIN) 200 MG tablet Take 800 mg by mouth 2 (two) times daily as needed for mild pain.    Historical Provider, Livingston  losartan (COZAAR) 100 MG tablet Take 1 tablet (100 mg total) by mouth daily. 03/25/14   Scott Livingston  Multiple Vitamins-Minerals (MULTIVITAMIN PO) Take 1 tablet by mouth daily.    Historical Provider, Livingston  traMADol (ULTRAM) 50 MG tablet Take 50 mg by mouth every 6 (six) hours as needed for moderate pain.    Historical Provider, Livingston   BP 210/140 mmHg  Pulse 93  Temp(Src) 97.5 F (36.4 C) (Oral)  Resp 25  SpO2 97% Physical Exam  Constitutional: He appears well-developed and well-nourished. No distress.  HENT:  Head: Normocephalic and atraumatic.  Eyes: Pupils are equal, round, and reactive to light.  Neck: Normal range of motion. Neck supple.  Cardiovascular: Normal rate and regular rhythm.  Pulmonary/Chest: Effort normal. He has no decreased breath sounds. He has no wheezes. He has rales (minimal at the lung bases).  Abdominal: Soft. Bowel sounds are normal. He exhibits no distension and no fluid wave. There is no tenderness. There is no rigidity.  Exam limited by omentum.   Musculoskeletal:  Bilateral lower extremity swelling. No pitting edema.  Neurological: He is alert.  Skin: Skin is warm and dry.  Nursing note and vitals reviewed.   ED Course  Procedures (including critical care time) Labs Review Labs Reviewed  CBC WITH DIFFERENTIAL/PLATELET  BRAIN NATRIURETIC  PEPTIDE  BASIC METABOLIC PANEL  TROPONIN I    Imaging Review No results found.   EKG Interpretation None      MDM   Final diagnoses:  Chest pain  Hypertension    Medications  benazepril (LOTENSIN) tablet 20 mg (20 mg Oral Given 06/04/14 1249)  labetalol (NORMODYNE,TRANDATE) injection 20 mg (not administered)  amLODipine (NORVASC) tablet 10 mg (10 mg Oral Given 06/04/14 1249)  labetalol (NORMODYNE,TRANDATE) injection 20 mg (20 mg Intravenous Given 06/04/14 1249)    Pt received 2 IV doses of labetolol and home dose of medications. His blood pressure improves but then rises again. He has had a mildly elevated Troponin. Mildly elevated BNP and his chest xray shows pulmonary vascular congestion. I feel that the patient will be best managed as inpatient. I have requested [facility] for admission, Scott Livingston has agreed to admit. Pt discussed with Scott Livingston. Case discussed with patient who understands the plan and is agreeable.  Filed Vitals:   06/04/14 1640  BP: 157/112  Pulse: 80  Temp:   Resp: [address]       Scott Gorby G Gage Treiber, PA-C 06/04/14 1704  Scott Matasiffany G Macel Yearsley, PA-C 06/04/14 1716  Scott MelterElliott L Wentz, Livingston 06/06/14 [phone]

## 2014-06-04 NOTE — ED Notes (Signed)
Attempted report to [address]. 

## 2014-06-04 NOTE — Progress Notes (Addendum)
   06/04/14 1745  Vitals  Temp 98.3 F (36.8 C)  Temp Source Oral  BP (!) 166/119 mmHg  BP Location Right Arm  BP Method Automatic  Patient Position (if appropriate) Sitting  Pulse Rate 74  ECG Heart Rate 74  Oxygen Therapy  SpO2 94 %  O2 Device Room Air  Height and Weight  Height 5\' 10"  (1.778 m)  Weight in (lb) to have BMI = 25 173.9  admission vitals.Larsen Dungan, Randall AnKristin Jessup RN

## 2014-06-04 NOTE — ED Notes (Signed)
2 weeks of exertional SOB, bilateral knee pain,  He reports he has run out of his BP meds 1 week ago.  He denies chest pain.  Skin w/d color good

## 2014-06-04 NOTE — ED Notes (Signed)
Pt arrives from Urgent Care via [facility]. Pt has c/o exertional SOB and HTN rt being out of BP medications for 1 week. Pt is currently not in any distress or having SOB.

## 2014-06-04 NOTE — H&P (Signed)
[facility] History and Physical  Scott Bundenthony Shumard WJX:[id number] DOB: 06/21/1970 DOA: 06/04/2014  Referring physician: EDP PCP: No PCP Per Patient   Chief Complaint: Elevated blood pressure  HPI: Scott Livingston is a 44 y.o. male with past medical history of hypertension, obesity and hypercholesterolemia, referred to the hospital from urgent care center because of high blood pressure. Patient has history of hypertension, appears to be poorly controlled. He reported for the past couple of weeks he was not taking his medications because he ran out of them. He went to see urgent care today and they referred him to the hospital because of very high blood pressure. Patient reported lower extremity edema, dyspnea on exertion, orthopnea and cough sometimes. In the ED his blood pressure was 210/140, chest x-ray showed vascular congestion, his BNP is 410 and slightly elevated troponin of 0.06, patient admitted to the hospital for further evaluation.   Review of Systems:  Constitutional: negative for anorexia, fevers and sweats Eyes: negative for irritation, redness and visual disturbance Ears, nose, mouth, throat, and face: negative for earaches, epistaxis, nasal congestion and sore throat Respiratory: negative for cough, dyspnea on exertion, sputum and wheezing Cardiovascular: Per history of present illness Gastrointestinal: negative for abdominal pain, constipation, diarrhea, melena, nausea and vomiting Genitourinary:negative for dysuria, frequency and hematuria Hematologic/lymphatic: negative for bleeding, easy bruising and lymphadenopathy Musculoskeletal:negative for arthralgias, muscle weakness and stiff joints Neurological: negative for coordination problems, gait problems, headaches and weakness Endocrine: negative for diabetic symptoms including polydipsia, polyuria and weight loss Allergic/Immunologic: negative for anaphylaxis, hay fever and urticaria  Past Medical History  Diagnosis  Date  . Hypertension   . Arthritis   . Hypercholesteremia    No past surgical history on file. Social History:   reports that he has quit smoking. He does not have any smokeless tobacco history on file. He reports that he drinks alcohol. He reports that he does not use illicit drugs.  No Known Allergies  Family history: Father has diabetes and mother has hypertension  Prior to Admission medications   Medication Sig Start Date End Date Taking? Authorizing Provider  amLODipine (NORVASC) 10 MG tablet Take 1 tablet (10 mg total) by mouth daily. 10/17/13  Yes Lyanne CoKevin M Campos, MD  benazepril (LOTENSIN) 20 MG tablet Take 1 tablet (20 mg total) by mouth daily. 10/17/13  Yes Lyanne CoKevin M Campos, MD  amLODipine (NORVASC) 10 MG tablet Take 1 tablet (10 mg total) by mouth daily. 03/25/14   Linna HoffJames D Kindl, MD  celecoxib (CELEBREX) 200 MG capsule Take 200 mg by mouth daily.    Historical Provider, MD  losartan (COZAAR) 100 MG tablet Take 1 tablet (100 mg total) by mouth daily. 03/25/14   Linna HoffJames D Kindl, MD  traMADol (ULTRAM) 50 MG tablet Take 50 mg by mouth every 6 (six) hours as needed for moderate pain.    Historical Provider, MD   Physical Exam: Filed Vitals:   06/04/14 1640  BP: 157/112  Pulse: 80  Temp:   Resp: 19   Constitutional: Oriented to person, place, and time.  Obese African-American male Head: Normocephalic and atraumatic.  Nose: Nose normal.  Mouth/Throat: Uvula is midline, oropharynx is clear and moist and mucous membranes are normal.  Eyes: Conjunctivae and EOM are normal. Pupils are equal, round, and reactive to light.  Neck: Trachea normal and normal range of motion. Neck supple.  Cardiovascular: Normal rate, regular rhythm, S1 normal, S2 normal, normal heart sounds and intact distal pulses.   Pulmonary/Chest: Effort normal and breath  sounds normal.  Abdominal: Soft. Bowel sounds are normal. There is no hepatosplenomegaly. There is no tenderness.  Musculoskeletal:  +2 bilateral pedal  edema  urological: Alert and oriented to person, place, and time. Has normal strength. No cranial nerve deficit or sensory deficit.  Skin: Skin is warm, dry and intact.  Psychiatric: Has a normal mood and affect. Speech is normal and behavior is normal.   Labs on Admission:  Basic Metabolic Panel:  Recent Labs Lab 06/04/14 1216  NA 141  K 3.1*  CL 105  CO2 25  GLUCOSE 116*  BUN 13  CREATININE 0.89  CALCIUM 8.8   Liver Function Tests: No results for input(s): AST, ALT, ALKPHOS, BILITOT, PROT, ALBUMIN in the last 168 hours. No results for input(s): LIPASE, AMYLASE in the last 168 hours. No results for input(s): AMMONIA in the last 168 hours. CBC:  Recent Labs Lab 06/04/14 1216  WBC 7.1  NEUTROABS 5.2  HGB 13.4  HCT 41.2  MCV 79.4  PLT 241   Cardiac Enzymes:  Recent Labs Lab 06/04/14 1216 06/04/14 1508  TROPONINI 0.06* 0.06*    BNP (last 3 results)  Recent Labs  06/04/14 1220  BNP 409.2*    ProBNP (last 3 results) No results for input(s): PROBNP in the last 8760 hours.  CBG: No results for input(s): GLUCAP in the last 168 hours.  Radiological Exams on Admission: Dg Chest Port 1 View  06/04/2014   CLINICAL DATA:  44 year old male with shortness of breath for 2 weeks. Chest pain and hypertension. Initial encounter.  EXAM: PORTABLE CHEST - 1 VIEW  COMPARISON:  None.  FINDINGS: Large body habitus. Portable AP upright view at 1234 hours. There is cardiomegaly. Other mediastinal contours are within normal limits. Pulmonary vascular congestion. No pneumothorax. No consolidation or large pleural effusion identified. Visualized tracheal air column is within normal limits.  IMPRESSION: Large body habitus, cardiomegaly, and pulmonary vascular congestion.   Electronically Signed   By: Odessa Fleming M.D.   On: 06/04/2014 13:17    EKG: Independently reviewed. Normal sinus rhythm  Assessment/Plan Active Problems:   Hypertensive urgency   Acute CHF   Obesity   DOE  (dyspnea on exertion)   Lower extremity edema   Hypokalemia   Elevated troponin    Acute CHF Present with SOB, orthopnea, lower extremity edema and elevated BNP. Chest x-ray showed vascular congestion, denies any fever or chills. Will get 2-D echocardiogram. Started on Lasix, control blood pressure with Coreg, amlodipine and hydralazine. Likely diastolic CHF, if 2-D echo showed systolic CHF cardiology will be consulted to rule out ischemic cardiomyopathy   Hypertensive urgency Along with acute CHF, blood pressure was 210/140 on admission, went down to 160 for systolic with IV hydralazine. Started on Coreg, amlodipine and hydralazine. If blood pressure is not controlled have very low threshold to start nitroglycerin drip.  Elevated troponin Slightly elevated troponin of 0.06, patient denies any chest pain. 2-D echocardiogram will be done, check for wall motion abnormalities. Elevated troponin is likely secondary to acute CHF. Unlikely to be ACS, 12-lead EKG no ischemic findings.  Hypokalemia Replete with oral supplements.  Code Status:  Full code  Family Communication:  plan discussed with the patient  Disposition Plan:  telemetry, inpatient   Time spent: 70 minutes  [id number] [facility] A [facility] Pager [phone]

## 2014-06-04 NOTE — ED Notes (Signed)
[facility] notified 

## 2014-06-04 NOTE — ED Notes (Signed)
Admitting at bedside 

## 2014-06-04 NOTE — Progress Notes (Signed)
Received patient from ED, VSS and placed on telemetry monitor, will monitor patient. Scott Livingston, Randall AnKristin Jessup RN

## 2014-06-04 NOTE — ED Provider Notes (Signed)
CSN: [id number]     Arrival date & time 06/04/14  [id number] History   First MD Initiated Contact with Patient 06/04/14 1041     Chief Complaint  Patient presents with  . Shortness of Breath   (Consider location/radiation/quality/duration/timing/severity/associated sxs/prior Treatment) HPI Comments: Pt does not have pcp; has appt to establish care at [facility] in one week. Ran out of bp meds a week ago. [facility] Denies cp but does report peripheral edema and sob with exertion that is new.   Patient is a 44 y.o. male presenting with shortness of breath and hypertension. The history is provided by the patient. No language interpreter was used.  Shortness of Breath Severity:  Moderate Duration: during exertion. Timing: with exertion. Progression:  Worsening Chronicity:  New Context: activity   Relieved by:  Rest Associated symptoms: no chest pain and no headaches   Hypertension This is a chronic problem. Episode onset: ran out of meds a week ago. The problem occurs constantly. The problem has been gradually worsening. Associated symptoms include shortness of breath. Pertinent negatives include no chest pain and no headaches.    Past Medical History  Diagnosis Date  . Hypertension   . Arthritis   . Hypercholesteremia    No past surgical history on file. No family history on file. History  Substance Use Topics  . Smoking status: Never Smoker   . Smokeless tobacco: Not on file  . Alcohol Use: Yes     Comment: occa    Review of Systems  Respiratory: Positive for shortness of breath.   Cardiovascular: Positive for leg swelling. Negative for chest pain and palpitations.  Neurological: Negative for headaches.    Allergies  Review of patient's allergies indicates no known allergies.  Home Medications   Prior to Admission medications   Medication Sig Start Date End Date Taking? Authorizing Provider  amLODipine (NORVASC) 10 MG tablet  Take 1 tablet (10 mg total) by mouth daily. 10/17/13  Yes Lyanne CoKevin M Campos, MD  amLODipine (NORVASC) 10 MG tablet Take 1 tablet (10 mg total) by mouth daily. 03/25/14  Yes Linna HoffJames D Kindl, MD  benazepril (LOTENSIN) 20 MG tablet Take 1 tablet (20 mg total) by mouth daily. 10/17/13  Yes Lyanne CoKevin M Campos, MD  hydrochlorothiazide (HYDRODIURIL) 25 MG tablet Take 1 tablet (25 mg total) by mouth daily. 10/17/13  Yes Lyanne CoKevin M Campos, MD  losartan (COZAAR) 100 MG tablet Take 1 tablet (100 mg total) by mouth daily. 03/25/14  Yes Linna HoffJames D Kindl, MD  Multiple Vitamins-Minerals (MULTIVITAMIN PO) Take 1 tablet by mouth daily.   Yes Historical Provider, MD  celecoxib (CELEBREX) 200 MG capsule Take 200 mg by mouth daily.    Historical Provider, MD  ibuprofen (ADVIL,MOTRIN) 200 MG tablet Take 800 mg by mouth 2 (two) times daily as needed for mild pain.    Historical Provider, MD  traMADol (ULTRAM) 50 MG tablet Take 50 mg by mouth every 6 (six) hours as needed for moderate pain.    Historical Provider, MD   BP 191/128 mmHg  Pulse 93  Temp(Src) 98.9 F (37.2 C) (Oral)  Resp 16  SpO2 98% Physical Exam  Constitutional: He appears well-developed and well-nourished. No distress.  Cardiovascular: Normal rate and regular rhythm.   No peripheral edema at this time  Pulmonary/Chest: Effort normal and breath sounds normal.    ED Course  Procedures (including critical care time) Labs Review Labs Reviewed - No data to display  Imaging Review No results found.  EKG Results:  Date: 06/04/2014  Rate: 95  Rhythm: NSR  QRS Axis:   Intervals: prolonged qt  ST/T Wave abnormalities: lateral T wave abnormality  Conduction Disutrbances:  Narrative Interpretation: concern for lateral ischemia  Old EKG Reviewed: compared with old ekg 10/2013, T wave changes are new      MDM   1. Essential hypertension   2. EKG abnormalities   3. Shortness of breath on exertion    Transfer to er for further eval.     Cathlyn Parsons,  NP 06/04/14 1057  Cathlyn Parsons, NP 06/04/14 1104

## 2014-06-04 NOTE — ED Notes (Signed)
[facility] called for transport. 

## 2014-06-04 NOTE — ED Notes (Signed)
Phlebotomy at the bedside  

## 2014-06-04 NOTE — ED Notes (Signed)
Xray at the bedside.

## 2014-06-05 DIAGNOSIS — I509 Heart failure, unspecified: Secondary | ICD-10-CM

## 2014-06-05 LAB — CBC
HCT: 43.5 % (ref 39.0–52.0)
Hemoglobin: 13.9 g/dL (ref 13.0–17.0)
MCH: 26.2 pg (ref 26.0–34.0)
MCHC: 32 g/dL (ref 30.0–36.0)
MCV: 81.9 fL (ref 78.0–100.0)
PLATELETS: 232 10*3/uL (ref 150–400)
RBC: 5.31 MIL/uL (ref 4.22–5.81)
RDW: 15.8 % — AB (ref 11.5–15.5)
WBC: 6.9 10*3/uL (ref 4.0–10.5)

## 2014-06-05 LAB — BASIC METABOLIC PANEL
ANION GAP: 7 (ref 5–15)
BUN: 13 mg/dL (ref 6–23)
CALCIUM: 8.7 mg/dL (ref 8.4–10.5)
CO2: 28 mmol/L (ref 19–32)
CREATININE: 1.07 mg/dL (ref 0.50–1.35)
Chloride: 105 mmol/L (ref 96–112)
GFR, EST NON AFRICAN AMERICAN: 85 mL/min — AB (ref >90)
Glucose, Bld: 101 mg/dL — ABNORMAL HIGH (ref 70–99)
Potassium: 3.3 mmol/L — ABNORMAL LOW (ref 3.5–5.1)
SODIUM: 140 mmol/L (ref 135–145)

## 2014-06-05 MED ORDER — POTASSIUM CHLORIDE CRYS ER 20 MEQ PO TBCR
40.0000 meq | EXTENDED_RELEASE_TABLET | Freq: Two times a day (BID) | ORAL | Status: AC
  Administered 2014-06-05 (×2): 40 meq via ORAL
  Filled 2014-06-05 (×2): qty 2

## 2014-06-05 MED ORDER — HYDRALAZINE HCL 20 MG/ML IJ SOLN
10.0000 mg | Freq: Once | INTRAMUSCULAR | Status: AC
  Administered 2014-06-05: 10 mg via INTRAVENOUS

## 2014-06-05 MED ORDER — LABETALOL HCL 5 MG/ML IV SOLN
10.0000 mg | Freq: Once | INTRAVENOUS | Status: AC
  Administered 2014-06-06: 10 mg via INTRAVENOUS
  Filled 2014-06-05: qty 4

## 2014-06-05 NOTE — Progress Notes (Signed)
UR Completed.  [phone]  

## 2014-06-05 NOTE — Progress Notes (Signed)
Nutrition Brief Note  Patient identified on the Malnutrition Screening Tool (MST) Report  Wt Readings from Last 15 Encounters:  10/17/13 310 lb (140.615 kg)    Body Mass index of 44.5 kg/(m^2). Patient meets criteria for Morbid Obesity based on current BMI (based on weight from previous admission.   Current diet order is Heart Healthy, patient is consuming approximately 100% of meals at this time. Patient denies any weight loss and reports having a good appetite. RD briefly discussed heart healthy diet and offered additional nutrition education. Patient states that he no longer uses salt. He primarily eats at home and uses a lot of garlic to flavor his food. He declines nutrition education at this time.  Labs and medications reviewed.   No nutrition interventions warranted at this time. If nutrition issues arise, please consult RD.   Ian Malkineanne Barnett RD, LDN Inpatient Clinical Dietitian Pager: [phone] After Hours Pager: [phone]

## 2014-06-05 NOTE — Progress Notes (Signed)
[facility] PROGRESS NOTE  Rylan Bernard ZOX:[id number] DOB: 1970-12-16 DOA: 06/04/2014 PCP: No PCP Per Patient Interim summary: 44 y.o. Male admitted for accelerated hypertension , was found to be in mild CHF and his troponins were elevated. He was admitted to medical service for further evaluation.  Assessment/Plan: 1. Acute congestive heart failure: unknown EF. He reports having CHF and on lasix. Getting an echocardiogram, mild elevated troponins probably from accelerated hypertension and acute Hf, he denies any chest pain. Resume lasix, coreg. Replete potassium as needed. Strict intake and output. Daily weights. Please call cardiology in am to set up care.  2. Hypokalemia replace as needed.  3. Accelerated hypertension: much better today.   Code Status: full code.  Family Communication: none at bedside Disposition Plan: pending.    Consultants:  none  Procedures:  Echo pending.   Antibiotics:  none  HPI/Subjective: Comfortable. Reports he moved from [place]. Does not have a cardiologist here.  He denies sob or chest pain.   Objective: Filed Vitals:   06/05/14 0835  BP: 137/80  Pulse: 82  Temp: 98 F (36.7 C)  Resp: 20    Intake/Output Summary (Last 24 hours) at 06/05/14 1600 Last data filed at 06/05/14 1300  Gross per 24 hour  Intake    240 ml  Output   2000 ml  Net  -1760 ml   There were no vitals filed for this visit.  Exam:   General:  Obese gentleman, sleeping comfortably, woke him up. Not on oxygen  Cardiovascular: s1s2 no mrg  Respiratory: clear to auscultation, no wheezing or rhonchi  Abdomen: soft nontender non distended bowel sounds heard, obese  Musculoskeletal: 2+ pedal edema.   Data Reviewed: Basic Metabolic Panel:  Recent Labs Lab 06/04/14 1216 06/05/14 0509  NA 141 140  K 3.1* 3.3*  CL 105 105  CO2 25 28  GLUCOSE 116* 101*  BUN 13 13  CREATININE 0.89 1.07  CALCIUM 8.8 8.7   Liver Function Tests: No results for  input(s): AST, ALT, ALKPHOS, BILITOT, PROT, ALBUMIN in the last 168 hours. No results for input(s): LIPASE, AMYLASE in the last 168 hours. No results for input(s): AMMONIA in the last 168 hours. CBC:  Recent Labs Lab 06/04/14 1216 06/05/14 0509  WBC 7.1 6.9  NEUTROABS 5.2  --   HGB 13.4 13.9  HCT 41.2 43.5  MCV 79.4 81.9  PLT 241 232   Cardiac Enzymes:  Recent Labs Lab 06/04/14 1216 06/04/14 1508  TROPONINI 0.06* 0.06*   BNP (last 3 results)  Recent Labs  06/04/14 1220  BNP 409.2*    ProBNP (last 3 results) No results for input(s): PROBNP in the last 8760 hours.  CBG: No results for input(s): GLUCAP in the last 168 hours.  No results found for this or any previous visit (from the past 240 hour(s)).   Studies: Dg Chest Port 1 View  06/04/2014   CLINICAL DATA:  44 year old male with shortness of breath for 2 weeks. Chest pain and hypertension. Initial encounter.  EXAM: PORTABLE CHEST - 1 VIEW  COMPARISON:  None.  FINDINGS: Large body habitus. Portable AP upright view at 1234 hours. There is cardiomegaly. Other mediastinal contours are within normal limits. Pulmonary vascular congestion. No pneumothorax. No consolidation or large pleural effusion identified. Visualized tracheal air column is within normal limits.  IMPRESSION: Large body habitus, cardiomegaly, and pulmonary vascular congestion.   Electronically Signed   By: Odessa Fleming M.D.   On: 06/04/2014 13:17  Scheduled Meds: . amLODipine  10 mg Oral Daily  . carvedilol  3.125 mg Oral BID WC  . furosemide  40 mg Intravenous BID  . heparin  5,000 Units Subcutaneous 3 times per day  . potassium chloride  40 mEq Oral BID  . sodium chloride  3 mL Intravenous Q12H   Continuous Infusions:   Active Problems:   Hypertensive urgency   Acute CHF   Obesity   DOE (dyspnea on exertion)   Lower extremity edema   Hypokalemia   Elevated troponin    Time spent: 25 minutes. Time spent includes 50% of time face to face  with the patient and co ordinating care and the above assessment and plan.     [facility] Psych[facility] CenterKULA,Andrewjames Weirauch  [facility] Pager [phone]. If 7PM-7AM, please contact night-coverage at [email or web address], password [email or web address] 06/05/2014, 4:00 PM  LOS: 1 day

## 2014-06-05 NOTE — Progress Notes (Signed)
  Echocardiogram 2D Echocardiogram has been performed.  Scott Livingston, Cris Gibby A 06/05/2014, 4:11 PM

## 2014-06-06 ENCOUNTER — Encounter (HOSPITAL_COMMUNITY): Payer: Self-pay | Admitting: General Practice

## 2014-06-06 DIAGNOSIS — I43 Cardiomyopathy in diseases classified elsewhere: Secondary | ICD-10-CM

## 2014-06-06 DIAGNOSIS — I248 Other forms of acute ischemic heart disease: Secondary | ICD-10-CM

## 2014-06-06 DIAGNOSIS — I110 Hypertensive heart disease with heart failure: Principal | ICD-10-CM

## 2014-06-06 DIAGNOSIS — I429 Cardiomyopathy, unspecified: Secondary | ICD-10-CM

## 2014-06-06 DIAGNOSIS — I5041 Acute combined systolic (congestive) and diastolic (congestive) heart failure: Secondary | ICD-10-CM

## 2014-06-06 DIAGNOSIS — I119 Hypertensive heart disease without heart failure: Secondary | ICD-10-CM | POA: Insufficient documentation

## 2014-06-06 LAB — BASIC METABOLIC PANEL
Anion gap: 12 (ref 5–15)
BUN: 12 mg/dL (ref 6–23)
CALCIUM: 9.1 mg/dL (ref 8.4–10.5)
CHLORIDE: 105 mmol/L (ref 96–112)
CO2: 23 mmol/L (ref 19–32)
CREATININE: 0.95 mg/dL (ref 0.50–1.35)
GFR calc Af Amer: >90 mL/min (ref >90)
GLUCOSE: 83 mg/dL (ref 70–99)
Potassium: 3.5 mmol/L (ref 3.5–5.1)
Sodium: 140 mmol/L (ref 135–145)

## 2014-06-06 LAB — HEMOGLOBIN A1C
HEMOGLOBIN A1C: 6 % — AB (ref 4.8–5.6)
Mean Plasma Glucose: 126 mg/dL

## 2014-06-06 LAB — CBC
HCT: 43.5 % (ref 39.0–52.0)
HEMOGLOBIN: 14 g/dL (ref 13.0–17.0)
MCH: 26 pg (ref 26.0–34.0)
MCHC: 32.2 g/dL (ref 30.0–36.0)
MCV: 80.9 fL (ref 78.0–100.0)
Platelets: 260 10*3/uL (ref 150–400)
RBC: 5.38 MIL/uL (ref 4.22–5.81)
RDW: 15.8 % — ABNORMAL HIGH (ref 11.5–15.5)
WBC: 6.4 10*3/uL (ref 4.0–10.5)

## 2014-06-06 MED ORDER — CARVEDILOL 12.5 MG PO TABS
12.5000 mg | ORAL_TABLET | Freq: Two times a day (BID) | ORAL | Status: DC
  Filled 2014-06-06 (×2): qty 1

## 2014-06-06 MED ORDER — ISOSORB DINITRATE-HYDRALAZINE 20-37.5 MG PO TABS
1.0000 | ORAL_TABLET | Freq: Three times a day (TID) | ORAL | Status: DC
  Administered 2014-06-06: 1 via ORAL
  Filled 2014-06-06 (×3): qty 1

## 2014-06-06 MED ORDER — ISOSORB DINITRATE-HYDRALAZINE 20-37.5 MG PO TABS
1.0000 | ORAL_TABLET | Freq: Three times a day (TID) | ORAL | Status: DC
  Filled 2014-06-06 (×2): qty 1

## 2014-06-06 MED ORDER — LISINOPRIL 20 MG PO TABS: 20.0000 mg | ORAL_TABLET | Freq: Every day | ORAL | Status: DC

## 2014-06-06 MED ORDER — CARVEDILOL 25 MG PO TABS: 25.0000 mg | ORAL_TABLET | Freq: Two times a day (BID) | ORAL | Status: DC

## 2014-06-06 MED ORDER — LISINOPRIL 20 MG PO TABS
20.0000 mg | ORAL_TABLET | Freq: Every day | ORAL | Status: DC
  Administered 2014-06-06: 20 mg via ORAL
  Filled 2014-06-06: qty 1

## 2014-06-06 MED ORDER — FUROSEMIDE 40 MG PO TABS: 40.0000 mg | ORAL_TABLET | Freq: Two times a day (BID) | ORAL | Status: DC

## 2014-06-06 MED ORDER — HYDRALAZINE HCL 20 MG/ML IJ SOLN
10.0000 mg | Freq: Four times a day (QID) | INTRAMUSCULAR | Status: DC | PRN
  Administered 2014-06-06: 10 mg via INTRAVENOUS
  Filled 2014-06-06: qty 1

## 2014-06-06 MED ORDER — ISOSORB DINITRATE-HYDRALAZINE 20-37.5 MG PO TABS: 1.0000 | ORAL_TABLET | Freq: Three times a day (TID) | ORAL | Status: DC

## 2014-06-06 MED ORDER — ISOSORB DINITRATE-HYDRALAZINE 20-37.5 MG PO TABS
1.0000 | ORAL_TABLET | Freq: Two times a day (BID) | ORAL | Status: DC
  Filled 2014-06-06 (×2): qty 1

## 2014-06-06 NOTE — Discharge Summary (Addendum)
Physician Discharge Summary  Scott Livingston ZOX:[id number] DOB: 02-Oct-1970 DOA: 06/04/2014  PCP: No PCP Per Patient  Admit date: 06/04/2014 Discharge date: 06/06/2014  Time spent: >30 minutes  Recommendations for Outpatient Follow-up:  recheck BP and adjust medications as needed Check BMET to follow up renal function and electrolytes Will benefit of stress test at the very minimum as an outpatient; patient will follow with cardiology  Discharge Diagnoses:  Active Problems:   Hypertensive urgency   Acute CHF   Obesity   DOE (dyspnea on exertion)   Lower extremity edema   Hypokalemia   Elevated troponin   Discharge Condition: stable and improved. Patient wants to be discharge after symptoms improved. Cardiology follow up arranged for 06/08/14 and he will follow at wellness center to establish care with PCP on 06/11/14  Diet recommendation: low sodium heart healthy diet  There were no vitals filed for this visit.  History of present illness:  44 y.o. male with past medical history of hypertension, obesity and hypercholesterolemia, referred to the hospital from urgent care center because of high blood pressure. Patient has history of hypertension, appears to be poorly controlled. He reported for the past couple of weeks he was not taking his medications because he ran out of them. He went to see urgent care today and they referred him to the hospital because of very high blood pressure. Patient reported lower extremity edema, dyspnea on exertion, orthopnea and cough sometimes. In the ED his blood pressure was 210/140, chest x-ray showed vascular congestion, his BNP is 410 and slightly elevated troponin of 0.06, patient admitted to the hospital for further evaluation.   Hospital Course:  1-acute on chronic combined systolic/diastolic heart failure: appears to be secondary to hypertensive cardiomyopathy -patient BP was controlled and diuresed with IV lasix -SOB, orthopnea and lower extremity  edema improved/resolved with treatment -patient advise to follow low sodium diet -to check his weight on daily basis -to be compliant with medications and to follow with cardiology as an outpatient for further evaluation and medication adjustment  2-hypertensive cardiomyopathy/accelerated HTN: as mentioned above; patient will be treated with carvedilol, lisinopril, bidil and lasix -advise to follow low sodium diet -will follow with PCP and cardiology as an outpatient  3-demand ischemia from hypertensive cardiomyopathy and acute on chronic CHF -no CP -no EKG changes for ischemia -echo with diffuse hypokinesis (suggesting hypertensive cardiomyopathy) -will follow with cardiology as an outpatient -treatment of BP as mentioned above  4-hypokalemia: repleted and resolved prior to discharge -due to diuresis most likely  5-obesity: low calorie diet and exercise discussed with patient    Procedures:  2-D echo  - Left ventricle: The cavity size was normal. Wall thickness was normal. Systolic function was mildly to moderately reduced. The estimated ejection fraction was in the range of 40% to 45%. Diffuse hypokinesis. Doppler parameters are consistent with a reversible restrictive pattern, indicative of decreased left ventricular diastolic compliance and/or increased left atrial pressure (grade 3 diastolic dysfunction). - Mitral valve: There was mild regurgitation. - Left atrium: The atrium was moderately dilated.  Consultations:  Cardiology was curbside (given no CP, no SOB and no ischemia on EKG; they will see patient in outpatient setting for further work up)  Discharge Exam: Filed Vitals:   06/06/14 1300  BP: 119/60  Pulse: 90  Temp: 97.9 F (36.6 C)  Resp: 20    General: no cough, no orthopnea symptoms; patient denies CP and SOB. Patient wants to go home. Cardiovascular: S1 and S2, no rubs  or gallops; soft SM appreciated Respiratory: no wheezing, no  crackles; good air movement Extremities: trace edema bilaterally  Discharge Instructions   Discharge Instructions    Diet - low sodium heart healthy    Complete by:  As directed      Discharge instructions    Complete by:  As directed   Follow a low sodium heart healthy diet (less than 2 gram daily) Take medications as prescribed Follow with cardiologist and with new PCP to establish care, arrange further evaluation and continue adjusting your medications. Check your weight on daily basis (any change of more than 3 pounds overnight and/or more than 5 pounds in a week is indicative of fluid build up; contact PCP or cardiology for further instructions)          Current Discharge Medication List    START taking these medications   Details  carvedilol (COREG) 25 MG tablet Take 1 tablet (25 mg total) by mouth 2 (two) times daily with a meal. Qty: 60 tablet, Refills: 1    furosemide (LASIX) 40 MG tablet Take 1 tablet (40 mg total) by mouth 2 (two) times daily. Qty: 60 tablet, Refills: 1    isosorbide-hydrALAZINE (BIDIL) 20-37.5 MG per tablet Take 1 tablet by mouth 3 (three) times daily. Qty: 90 tablet, Refills: 1    lisinopril (PRINIVIL,ZESTRIL) 20 MG tablet Take 1 tablet (20 mg total) by mouth daily. Qty: 30 tablet, Refills: 1      CONTINUE these medications which have NOT CHANGED   Details  celecoxib (CELEBREX) 200 MG capsule Take 200 mg by mouth daily.    traMADol (ULTRAM) 50 MG tablet Take 50 mg by mouth every 6 (six) hours as needed for moderate pain.      STOP taking these medications     amLODipine (NORVASC) 10 MG tablet      benazepril (LOTENSIN) 20 MG tablet      amLODipine (NORVASC) 10 MG tablet      losartan (COZAAR) 100 MG tablet        No Known Allergies Follow-up Information    Follow up with [facility] On 06/11/2014.   Why:  has appointment set for 2:15 with [name]   Contact information:   [address] [place] [id number] [phone]      Follow up with [name], [name], MD On 06/08/2014.   Specialty:  Cardiology   Why:  2:30 PM   Contact information:   [address] [place] [id number] [phone]       The results of significant diagnostics from this hospitalization (including imaging, microbiology, ancillary and laboratory) are listed below for reference.    Significant Diagnostic Studies: Dg Chest Port 1 View  06/04/2014   CLINICAL DATA:  44 year old male with shortness of breath for 2 weeks. Chest pain and hypertension. Initial encounter.  EXAM: PORTABLE CHEST - 1 VIEW  COMPARISON:  None.  FINDINGS: Large body habitus. Portable AP upright view at 1234 hours. There is cardiomegaly. Other mediastinal contours are within normal limits. Pulmonary vascular congestion. No pneumothorax. No consolidation or large pleural effusion identified. Visualized tracheal air column is within normal limits.  IMPRESSION: Large body habitus, cardiomegaly, and pulmonary vascular congestion.   Electronically Signed   By: Odessa FlemingH  Hall M.D.   On: 06/04/2014 13:17   Labs: Basic Metabolic Panel:  Recent Labs Lab 06/04/14 1216 06/05/14 0509 06/06/14 0440  NA 141 140 140  K 3.1*  3.3* 3.5  CL 105 105 105  CO2 GLUCOSE 116* 101* 83  BUN CREATININE 0.89 1.07 0.95  CALCIUM 8.8 8.7 9.1   CBC:  Recent Labs Lab 06/04/14 1216 06/05/14 0509 06/06/14 0440  WBC 7.1 6.9 6.4  NEUTROABS 5.2  --   --   HGB 13.4 13.9 14.0  HCT 41.2 43.5 43.5  MCV 79.4 81.9 80.9  PLT 241 232 260   Cardiac Enzymes:  Recent Labs Lab 06/04/14 1216 06/04/14 1508  TROPONINI 0.06* 0.06*   BNP: BNP (last 3 results)  Recent Labs  06/04/14 1220  BNP 409.2*    Signed:  Vassie Loll  [facility] 06/06/2014, 2:44 PM

## 2014-06-06 NOTE — Clinical Documentation Improvement (Addendum)
  Per Dr. Blake DivineAkula on 06/05/14, "mild elevated troponins probably from accelerated hypertension and acute Hf, he denies any chest pain."  Both Troponin I levels on 06/04/14 were 0.06 ng/mL.  Can you please provide a possible cause for the elevated troponins?  Thank you for your time.     Possible Clinical Conditions:  Demand ischemia (from HTN and acute CHF)                                                 NSTEMI                                                 Acute coronary syndrome                                                 Other condition ____________   Thank you,   Darla LeschesWendy Wright, RN, BSN, CCRN Clinical Documentation Improvement Specialist HIM department--[place] Office [phone]  Demand ischemia in patient with acute on chronic combined CHF due to hypertensive cardiomyopathy.. Will add info to discharge summary  Vassie LollMadera, Nizar Cutler [phone]

## 2014-06-06 NOTE — Care Management Note (Signed)
    Page 1 of 1   06/06/2014     2:01:27 PM CARE MANAGEMENT NOTE 06/06/2014  Patient:  Scott Livingston,Scott Livingston   Account Number:  [id number]  Date Initiated:  06/05/2014  Documentation initiated by:  Donn PieriniWEBSTER,Jadan Hinojos  Subjective/Objective Assessment:   Pt admitted with COPD, CHF     Action/Plan:   PTA Pt lived at home   Anticipated DC Date:  06/06/2014   Anticipated DC Plan:  HOME/SELF CARE      DC Planning Services  CM consult  Medication Assistance  [facility]      Choice offered to / List presented to:             Status of service:  Completed, signed off Medicare Important Message given?   (If response is "NO", the following Medicare IM given date fields will be blank) Date Medicare IM given:   Medicare IM given by:   Date Additional Medicare IM given:   Additional Medicare IM given by:    Discharge Disposition:  HOME/SELF CARE  Per UR Regulation:  Reviewed for med. necessity/level of care/duration of stay  If discussed at Long Length of Stay Meetings, dates discussed:    Comments:  06/06/24- 1145- Donn PieriniKristi Jaycelyn Orrison RN, BSN [phone] Pt for d/c home today, most meds on $4 list- some are not- [facility] that [facility] can get [facility] letter given to pt and explained use of letter. copay cost $3 per prescription- one time use in 12 mo period- pt verbalized understanding.  06/05/14- 1645- Rexton Greulich RN, BSN [phone] [place], spoke with pt at bedside- who states that [place] of his meds when he moved here before he could establish with new PCP- [facility] on 06/11/14 at 2:15-pt reports that he can afford generic meds that are on $4 list, will assess for any that may be more expensive- pt would be eligible for MATCH if needed. Spoke with Dr. Blake DivineAkula- plan for d/c possible in am. NCM to f/u with pt prior to discharge for any medication  assistance needs.

## 2014-06-08 ENCOUNTER — Ambulatory Visit (INDEPENDENT_AMBULATORY_CARE_PROVIDER_SITE_OTHER): Payer: Self-pay | Admitting: Cardiology

## 2014-06-08 ENCOUNTER — Encounter: Payer: Self-pay | Admitting: Cardiology

## 2014-06-08 VITALS — BP 110/56 | HR 83 | Ht 70.0 in | Wt 325.0 lb

## 2014-06-08 DIAGNOSIS — I429 Cardiomyopathy, unspecified: Secondary | ICD-10-CM

## 2014-06-08 DIAGNOSIS — I160 Hypertensive urgency: Secondary | ICD-10-CM

## 2014-06-08 DIAGNOSIS — I5021 Acute systolic (congestive) heart failure: Secondary | ICD-10-CM

## 2014-06-08 DIAGNOSIS — I10 Essential (primary) hypertension: Secondary | ICD-10-CM

## 2014-06-08 NOTE — Progress Notes (Signed)
Patient ID: Scott Livingston, male   DOB: Jul 29, 1970, 45 y.o.   MRN: [id number]    Patient Name: Scott Livingston Date of Encounter: 06/08/2014  Primary Care Provider:  No PCP Per Patient Primary Cardiologist: Lars Masson  Problem List   Past Medical History  Diagnosis Date  . Hypertension   . Hypercholesteremia   . Shortness of breath dyspnea   . Myocardial infarction ~ 2010    "mild; I lived in [place]"  . Sleep apnea     "never received a mask" (06/05/2014)  . Headache     "maybe monthly" (06/05/2014)  . Arthritis     "knees" (06/05/2014)  . Acute CHF (congestive heart failure)     [name] 06/05/2014   Past Surgical History  Procedure Laterality Date  . No past surgeries      Allergies  No Known Allergies  HPI  44 y.o. male with past medical history of hypertension, obesity and hypercholesterolemia, who presented to urgent care with shortness of breath, orthopnea proximal nocturnal dyspnea or lower extremity edema and was diagnosed with hypertensive urgency. The patient was admitted and started on multiple medication including BiDil, carvedilol, lisinopril and furosemide he was treated for congestive heart failure was found to have decreased left reticular ejection fraction of 40% that was believed secondary to poorly controlled hypertension. Patient also had mildly elevated troponin at 0.06 believed to be secondary to hypertensive urgency and heart failure. He is coming today and states that he feels significantly better with resolved lower extremity edema, improved orthopnea and no paroxysmal nocturnal dyspnea his dyspnea on exertion has improved. 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 He denies claudications, palpitations or syncope. The patient is a former smoker quit in 2011 and has no family history of premature coronary artery disease.   Home Medications  Prior to Admission medications   Medication Sig Start  Date End Date Taking? Authorizing Provider  carvedilol (COREG) 25 MG tablet Take 1 tablet (25 mg total) by mouth 2 (two) times daily with a meal. 06/06/14  Yes Vassie Loll, MD  furosemide (LASIX) 40 MG tablet Take 1 tablet (40 mg total) by mouth 2 (two) times daily. 06/06/14  Yes Vassie Loll, MD  isosorbide-hydrALAZINE (BIDIL) 20-37.5 MG per tablet Take 1 tablet by mouth 3 (three) times daily. 06/06/14  Yes Vassie Loll, MD  lisinopril (PRINIVIL,ZESTRIL) 20 MG tablet Take 1 tablet (20 mg total) by mouth daily. 06/06/14  Yes Vassie Loll, MD  celecoxib (CELEBREX) 200 MG capsule Take 200 mg by mouth daily.    Historical Provider, MD  traMADol (ULTRAM) 50 MG tablet Take 50 mg by mouth every 6 (six) hours as needed for moderate pain.    Historical Provider, MD    Family History  Family History  Problem Relation Age of Onset  . Diabetes Father   . Hypertension Mother     Social History  History   Social History  . Marital Status: Single    Spouse Name: N/A  . Number of Children: N/A  . Years of Education: N/A   Occupational History  . Not on file.   Social History Main Topics  . Smoking status: Former Smoker -- 0.12 packs/day for 20 years    Types: Cigarettes  . Smokeless tobacco: Never Used     Comment: "stopped smoking in ~ 2010-2011"  . Alcohol Use: Yes     Comment: 06/05/2014 "might have a few drinks/month"  .  Drug Use: No  . Sexual Activity: Not Currently   Other Topics Concern  . Not on file   Social History Narrative     Review of Systems, as per HPI, otherwise negative General:  No chills, fever, night sweats or weight changes.  Cardiovascular:  No chest pain, dyspnea on exertion, edema, orthopnea, palpitations, paroxysmal nocturnal dyspnea. Dermatological: No rash, lesions/masses Respiratory: No cough, dyspnea Urologic: No hematuria, dysuria Abdominal:   No nausea, vomiting, diarrhea, bright red blood per rectum, melena, or hematemesis Neurologic:  No visual  changes, wkns, changes in mental status. All other systems reviewed and are otherwise negative except as noted above.  Physical Exam  Blood pressure 110/56, pulse 83, height  (1.778 m), weight 325 lb (147.419 kg), SpO2 95 %.  General: Pleasant, NAD, morbidly obese Psych: Normal affect. Neuro: Alert and oriented X 3. Moves all extremities spontaneously. HEENT: Normal  Neck: Supple without bruits or JVD. Lungs:  Resp regular and unlabored, CTA. Heart: RRR no s3, s4, or murmurs. Abdomen: Soft, non-tender, non-distended, BS + x 4.  Extremities: No clubbing, cyanosis or edema. DP/PT/Radials 2+ and equal bilaterally.  Labs:  No results for input(s): CKTOTAL, CKMB, TROPONINI in the last 72 hours. Lab Results  Component Value Date   WBC 6.4 06/06/2014   HGB 14.0 06/06/2014   HCT 43.5 06/06/2014   MCV 80.9 06/06/2014   PLT 260 06/06/2014    No results found for: DDIMER Invalid input(s): POCBNP    Component Value Date/Time   NA 140 06/06/2014 0440   K 3.5 06/06/2014 0440   CL 105 06/06/2014 0440   CO2 23 06/06/2014 0440   GLUCOSE 83 06/06/2014 0440   BUN 12 06/06/2014 0440   CREATININE 0.95 06/06/2014 0440   CALCIUM 9.1 06/06/2014 0440   GFRNONAA >90 06/06/2014 0440   GFRAA >90 06/06/2014 0440   No results found for: CHOL, HDL, LDLCALC, TRIG  Accessory Clinical Findings  Echocardiogram - 06/05/2014 Left ventricle: The cavity size was normal. Wall thickness was normal. Systolic function was mildly to moderately reduced. The estimated ejection fraction was in the range of 40% to 45%. Diffuse hypokinesis. Doppler parameters are consistent with a reversible restrictive pattern, indicative of decreased left ventricular diastolic compliance and/or increased left atrial pressure (grade 3 diastolic dysfunction). - Mitral valve: There was mild regurgitation. - Left atrium: The atrium was moderately dilated.  ECG - SR, LVH with repolarization  abnormalities    Assessment & Plan  44 year old male   1-acute on chronic combined systolic/diastolic heart failure: possibly sec long-standing poorly controlled hypertension. Patient's blood pressure is currently well controlled we'll continue the same regimen and check BMP in one month. Advised low-sodium diet and weight monitoring with CHF.  2. Cardiomyopathy - of unknown etiology, this is possibly secondary to poorly controlled long-standing hypertension, however ischemia needs to be rule out to specifically with symptoms of exertional chest pain. We will schedule a stress MRI 1 months from now that will answer questions to regards of LV size, thickness and function, it will be able to assess if there was any prior myocardial infarction and if patient has any ischemia.  3. hypertensive cardiomyopathy/accelerated HTN:  - Controlled on carvedilol, lisinopril, bidil and lasix -advise to follow low sodium diet  4. Elevated troponin - most probably demand ischemia from hypertensive cardiomyopathy and acute on chronic CHF -we will schedule stress MRI  5-obesity: low calorie diet and exercise discussed with patient  Follow-up in 6 weeks, prior to  the visit we will check compressive metabolic profile and stress MRI.  [name], Romonia Yanik H, MD, [facility] 06/08/2014, 2:52 PM

## 2014-06-08 NOTE — Patient Instructions (Addendum)
Your physician recommends that you continue on your current medications as directed. Please refer to the Current Medication list given to you today.  Your physician recommends that you return for lab work in: 1 month (BMET and LFT's)  Your physician has requested that you have a cardiac MRI. Cardiac MRI uses a computer to create images of your heart as its beating, producing both still and moving pictures of your heart and major blood vessels. For further information please visit [email or web address]. Please follow the instruction sheet given to you today for more information. To be done in 1 month.  Your physician recommends that you schedule a follow-up appointment in: 6 weeks

## 2014-06-11 ENCOUNTER — Ambulatory Visit: Payer: Self-pay | Attending: Internal Medicine | Admitting: Internal Medicine

## 2014-06-11 ENCOUNTER — Encounter: Payer: Self-pay | Admitting: Internal Medicine

## 2014-06-11 VITALS — BP 141/89 | HR 79 | Temp 98.5°F | Resp 16 | Ht 71.0 in | Wt 325.0 lb

## 2014-06-11 DIAGNOSIS — E669 Obesity, unspecified: Secondary | ICD-10-CM | POA: Insufficient documentation

## 2014-06-11 DIAGNOSIS — Z6841 Body Mass Index (BMI) 40.0 and over, adult: Secondary | ICD-10-CM | POA: Insufficient documentation

## 2014-06-11 DIAGNOSIS — Z791 Long term (current) use of non-steroidal anti-inflammatories (NSAID): Secondary | ICD-10-CM | POA: Insufficient documentation

## 2014-06-11 DIAGNOSIS — I252 Old myocardial infarction: Secondary | ICD-10-CM | POA: Insufficient documentation

## 2014-06-11 DIAGNOSIS — I5041 Acute combined systolic (congestive) and diastolic (congestive) heart failure: Secondary | ICD-10-CM | POA: Insufficient documentation

## 2014-06-11 DIAGNOSIS — Z87891 Personal history of nicotine dependence: Secondary | ICD-10-CM | POA: Insufficient documentation

## 2014-06-11 DIAGNOSIS — M170 Bilateral primary osteoarthritis of knee: Secondary | ICD-10-CM | POA: Insufficient documentation

## 2014-06-11 DIAGNOSIS — I10 Essential (primary) hypertension: Secondary | ICD-10-CM | POA: Insufficient documentation

## 2014-06-11 MED ORDER — TRAMADOL HCL 50 MG PO TABS: ORAL_TABLET | ORAL | Status: DC

## 2014-06-11 NOTE — Progress Notes (Signed)
Patient ID: Scott Livingston, male   DOB: [birth year], 44 y.o.   MRN: [id number]  WUJ:[id number]  NFA:[id number]  DOB - Dec 22, 1970  CC:  Chief Complaint  Patient presents with  . Establish Care       HPI: Scott Livingston is a 44 y.o. male here today to establish medical care.  Patient has a past medical history of arthritis, HTN, HLD, and CHF. He was discharged from the hospital on 06/06/14 with a diagnosis of HTN and CHF. He was given Bidil, Lasix, Coreg, and Lisinopril.  He has since been to the cardiologist and was told to continue his current regimen.  He does not currently exercise and is morbidly obese.  He complains of arthritis in both knees that is progressively worsening when he stands for long periods of time. He was recently on Celebrex but was taking off due to hypertensive urgency and HF. He is currently taking tylenol arthritis without relief.   Patient has No headache, No chest pain, No abdominal pain - No Nausea, No new weakness tingling or numbness, No Cough - SOB, orthopnea, or edema.  No Known Allergies Past Medical History  Diagnosis Date  . Hypertension   . Hypercholesteremia   . Shortness of breath dyspnea   . Myocardial infarction ~ 2010    "mild; I lived in [place]"  . Sleep apnea     "never received a mask" (06/05/2014)  . Headache     "maybe monthly" (06/05/2014)  . Arthritis     "knees" (06/05/2014)  . Acute CHF (congestive heart failure)     [name] 06/05/2014   Current Outpatient Prescriptions on File Prior to Visit  Medication Sig Dispense Refill  . carvedilol (COREG) 25 MG tablet Take 1 tablet (25 mg total) by mouth 2 (two) times daily with a meal. 60 tablet 1  . celecoxib (CELEBREX) 200 MG capsule Take 200 mg by mouth daily.    . furosemide (LASIX) 40 MG tablet Take 1 tablet (40 mg total) by mouth 2 (two) times daily. 60 tablet 1  . isosorbide-hydrALAZINE (BIDIL) 20-37.5 MG per tablet Take 1 tablet by mouth 3 (three) times daily. 90 tablet 1  . lisinopril  (PRINIVIL,ZESTRIL) 20 MG tablet Take 1 tablet (20 mg total) by mouth daily. 30 tablet 1  . traMADol (ULTRAM) 50 MG tablet Take 50 mg by mouth every 6 (six) hours as needed for moderate pain.     No current facility-administered medications on file prior to visit.   Family History  Problem Relation Age of Onset  . Diabetes Father   . Hypertension Mother    History   Social History  . Marital Status: Single    Spouse Name: N/A  . Number of Children: N/A  . Years of Education: N/A   Occupational History  . Not on file.   Social History Main Topics  . Smoking status: Former Smoker -- 0.12 packs/day for 20 years    Types: Cigarettes  . Smokeless tobacco: Never Used     Comment: "stopped smoking in ~ 2010-2011"  . Alcohol Use: Yes     Comment: 06/05/2014 "might have a few drinks/month"  . Drug Use: No  . Sexual Activity: Not Currently   Other Topics Concern  . Not on file   Social History Narrative    Review of Systems: See HPI.    Objective:   Filed Vitals:   06/11/14 1424  BP: 141/89  Pulse: 79  Temp: 98.5 F (36.9 C)  Resp: 16  Physical Exam  Constitutional: He is oriented to person, place, and time.  Neck: No JVD present.  Cardiovascular: Normal rate, regular rhythm and normal heart sounds.   Pulmonary/Chest: Effort normal and breath sounds normal.  Musculoskeletal: He exhibits no edema.  Neurological: He is alert and oriented to person, place, and time.     Lab Results  Component Value Date   WBC 6.4 06/06/2014   HGB 14.0 06/06/2014   HCT 43.5 06/06/2014   MCV 80.9 06/06/2014   PLT 260 06/06/2014   Lab Results  Component Value Date   CREATININE 0.95 06/06/2014   BUN 12 06/06/2014   NA 140 06/06/2014   K 3.5 06/06/2014   CL 105 06/06/2014   CO2 23 06/06/2014    Lab Results  Component Value Date   HGBA1C 6.0* 06/04/2014   Lipid Panel  No results found for: CHOL, TRIG, HDL, CHOLHDL, VLDL, LDLCALC     Assessment and plan:   Scott Livingston  was seen today for establish care.  Diagnoses and all orders for this visit:  Acute combined systolic and diastolic congestive heart failure Went over daily weights and what he should do if 3-5 lbs weight gain. He will continue f/u with Cardiology.  Essential hypertension Patient blood pressure is stable and may continue on current medication.  Education on diet, exercise, and modifiable risk factors discussed. Will obtain appropriate labs as needed. Will follow up in 3-6 months.   Osteoarthritis of both knees, unspecified osteoarthritis type Orders: -     traMADol (ULTRAM) 50 MG tablet; May take 1-2 tablets twice daily as needed for pain Will not give patient NSAID due to past hypertensive urgency and CHF. Will send patient to sports med once he receives orange card. Asked patient to use heat, lose weight, and tylenol arthritis. He may use Tramadol only when pain is severe to help take the edge off.   Obesity Weight loss discussed at length and its complications to health.  Patient will loss 10 months by next visit in 3 months.  Diet and exercise discussed as well as calorie intake.   Return in about 3 months (around 09/09/2014) for Heart Failure and HTN.  Will send sports med referral once patient gets orange card      Holland CommonsKECK, VALERIE, NP-C [facility] [phone] 06/11/2014, 3:00 PM

## 2014-06-11 NOTE — Progress Notes (Signed)
Pt is here to establish care. Pt has a history of arthritis, HTN, hyperlipidemia and CHF. Pt was in the ED for 3 days with elevated BP.

## 2014-06-11 NOTE — Patient Instructions (Signed)

## 2014-06-12 ENCOUNTER — Encounter: Payer: Self-pay | Admitting: Cardiology

## 2014-06-20 ENCOUNTER — Other Ambulatory Visit (INDEPENDENT_AMBULATORY_CARE_PROVIDER_SITE_OTHER): Payer: Self-pay | Admitting: *Deleted

## 2014-06-20 DIAGNOSIS — I429 Cardiomyopathy, unspecified: Secondary | ICD-10-CM

## 2014-06-20 LAB — HEPATIC FUNCTION PANEL
ALT: 29 U/L (ref 0–53)
AST: 21 U/L (ref 0–37)
Albumin: 3.9 g/dL (ref 3.5–5.2)
Alkaline Phosphatase: 66 U/L (ref 39–117)
Bilirubin, Direct: 0.1 mg/dL (ref 0.0–0.3)
Total Bilirubin: 0.4 mg/dL (ref 0.2–1.2)
Total Protein: 6.9 g/dL (ref 6.0–8.3)

## 2014-06-20 LAB — BASIC METABOLIC PANEL
BUN: 14 mg/dL (ref 6–23)
CO2: 27 mEq/L (ref 19–32)
Calcium: 9 mg/dL (ref 8.4–10.5)
Chloride: 102 mEq/L (ref 96–112)
Creatinine, Ser: 1.1 mg/dL (ref 0.40–1.50)
GFR: 94.64 mL/min (ref >60.00)
Glucose, Bld: 109 mg/dL — ABNORMAL HIGH (ref 70–99)
Potassium: 3.9 mEq/L (ref 3.5–5.1)
Sodium: 138 mEq/L (ref 135–145)

## 2014-07-18 ENCOUNTER — Ambulatory Visit (HOSPITAL_COMMUNITY)
Admission: RE | Admit: 2014-07-18 | Discharge: 2014-07-18 | Disposition: A | Discharge status: Home / Self Care | Payer: Self-pay | Source: Ambulatory Visit | Attending: Cardiology | Admitting: Cardiology

## 2014-07-18 ENCOUNTER — Telehealth: Payer: Self-pay | Admitting: *Deleted

## 2014-07-18 DIAGNOSIS — I429 Cardiomyopathy, unspecified: Secondary | ICD-10-CM

## 2014-07-18 NOTE — Telephone Encounter (Signed)
LMTCB to endorse to the pt that per Dr Delton SeeNelson, he needs to be scheduled for an exercise nuclear study, and if he cannot walk, then its ok to order for a Lexiscan.  Per Dr Delton SeeNelson this pt was scheduled for a stress MRI today,  but didn't fit in the scanner.  Will continue to follow-up with the pt.

## 2014-07-18 NOTE — Telephone Encounter (Signed)
LMTCB ---Attempt # 2 at trying to reach the pt

## 2014-07-19 ENCOUNTER — Encounter: Payer: Self-pay | Admitting: *Deleted

## 2014-07-19 NOTE — Telephone Encounter (Signed)
Attempted to contact this pt for the 4th time with no response, to set up for the pt to have a myoview done per Dr Delton SeeNelson.  Will send Dr Delton SeeNelson a message to make her aware of no response.  Will send a letter to follow-up with the office to schedule this.

## 2014-07-19 NOTE — Telephone Encounter (Signed)
LMTCB to have pt set up for a nuclear stress test per Dr Delton SeeNelson, due to the pt not being able to fit in the stress MRI yesterday.  3rd attempt at trying to reach the pt with no return call back.

## 2014-07-23 ENCOUNTER — Ambulatory Visit: Payer: Self-pay | Admitting: Cardiology

## 2014-07-24 ENCOUNTER — Other Ambulatory Visit: Payer: Self-pay | Admitting: Internal Medicine

## 2014-07-24 DIAGNOSIS — M170 Bilateral primary osteoarthritis of knee: Secondary | ICD-10-CM

## 2014-07-24 NOTE — Telephone Encounter (Signed)
Attempted to contact this pt multiple times to schedule him for either or exercise or lexiscan myoview per Dr Delton SeeNelson.  Pt has not return call back or responded to letter to follow-up with our office.  Pt was scheduled for a OV with Dr Delton SeeNelson yesterday 4/11 and was a no show.  Dr Delton SeeNelson is aware of multiple attempts to contact the pt.

## 2014-07-27 ENCOUNTER — Encounter: Payer: Self-pay | Admitting: Cardiology

## 2014-08-27 ENCOUNTER — Ambulatory Visit: Payer: Self-pay | Attending: Internal Medicine | Admitting: Internal Medicine

## 2014-08-27 ENCOUNTER — Encounter: Payer: Self-pay | Admitting: Internal Medicine

## 2014-08-27 VITALS — BP 163/93 | HR 69 | Temp 98.5°F | Ht 71.0 in | Wt 338.0 lb

## 2014-08-27 DIAGNOSIS — M170 Bilateral primary osteoarthritis of knee: Secondary | ICD-10-CM

## 2014-08-27 DIAGNOSIS — I5041 Acute combined systolic (congestive) and diastolic (congestive) heart failure: Secondary | ICD-10-CM

## 2014-08-27 DIAGNOSIS — R600 Localized edema: Secondary | ICD-10-CM

## 2014-08-27 MED ORDER — ISOSORB DINITRATE-HYDRALAZINE 20-37.5 MG PO TABS: 1.0000 | ORAL_TABLET | Freq: Three times a day (TID) | ORAL | Status: DC

## 2014-08-27 MED ORDER — FUROSEMIDE 40 MG PO TABS: 40.0000 mg | ORAL_TABLET | Freq: Two times a day (BID) | ORAL | Status: DC

## 2014-08-27 MED ORDER — TRAMADOL HCL 50 MG PO TABS: ORAL_TABLET | ORAL | Status: DC

## 2014-08-27 MED ORDER — CARVEDILOL 25 MG PO TABS: 25.0000 mg | ORAL_TABLET | Freq: Two times a day (BID) | ORAL | Status: DC

## 2014-08-27 MED ORDER — LISINOPRIL 20 MG PO TABS: 20.0000 mg | ORAL_TABLET | Freq: Every day | ORAL | Status: DC

## 2014-08-27 NOTE — Progress Notes (Signed)
Patient ID: Scott Livingston, male   DOB: 11/12/1970, 44 y.o.   MRN: [id number]  CC: edema  HPI: Scott Livingston is a 44 y.o. male here today for a follow up visit.  Patient has past medical history of CHF, HTN, HLD, and sleep apnea. Patient reports that he has been having some swelling of his BLE but reports that he has not been taking his lasix as prescribed due to side effects. He does not like having to urinate several times per day. He reports that he has not taken his lasix today or his bidil. He has not had his nuclear stress test from cardiology due to scheduling complications. He continues to report bilateral knee pain.  Patient has No headache, No chest pain, No abdominal pain - No Nausea, No new weakness tingling or numbness, No Cough - SOB.  No Known Allergies Past Medical History  Diagnosis Date  . Hypertension   . Hypercholesteremia   . Shortness of breath dyspnea   . Myocardial infarction ~ 2010    "mild; I lived in [place]"  . Sleep apnea     "never received a mask" (06/05/2014)  . Headache     "maybe monthly" (06/05/2014)  . Arthritis     "knees" (06/05/2014)  . Acute CHF (congestive heart failure)     [name]/notes 06/05/2014   Current Outpatient Prescriptions on File Prior to Visit  Medication Sig Dispense Refill  . carvedilol (COREG) 25 MG tablet Take 1 tablet (25 mg total) by mouth 2 (two) times daily with a meal. 60 tablet 1  . furosemide (LASIX) 40 MG tablet Take 1 tablet (40 mg total) by mouth 2 (two) times daily. 60 tablet 1  . isosorbide-hydrALAZINE (BIDIL) 20-37.5 MG per tablet Take 1 tablet by mouth 3 (three) times daily. 90 tablet 1  . lisinopril (PRINIVIL,ZESTRIL) 20 MG tablet Take 1 tablet (20 mg total) by mouth daily. 30 tablet 1  . traMADol (ULTRAM) 50 MG tablet May take 1-2 tablets twice daily as needed for pain 60 tablet 0   No current facility-administered medications on file prior to visit.   Family History  Problem Relation Age of Onset  . Diabetes Father   .  Hypertension Mother    History   Social History  . Marital Status: Single    Spouse Name: N/A  . Number of Children: N/A  . Years of Education: N/A   Occupational History  . Not on file.   Social History Main Topics  . Smoking status: Former Smoker -- 0.12 packs/day for 20 years    Types: Cigarettes  . Smokeless tobacco: Never Used     Comment: "stopped smoking in ~ 2010-2011"  . Alcohol Use: Yes     Comment: 06/05/2014 "might have a few drinks/month"  . Drug Use: No  . Sexual Activity: Not Currently   Other Topics Concern  . Not on file   Social History Narrative    Review of Systems: See HPI    Objective:   Filed Vitals:   08/27/14 1534  BP: 163/93  Pulse: 69  Temp: 98.5 F (36.9 C)    Physical Exam  Cardiovascular: Normal rate, regular rhythm and normal heart sounds.   Pulmonary/Chest: Effort normal and breath sounds normal.  Musculoskeletal: He exhibits edema.  Neurological: He is alert.  Skin: Skin is warm and dry.     Lab Results  Component Value Date   WBC 6.4 06/06/2014   HGB 14.0 06/06/2014   HCT 43.5 06/06/2014  MCV 80.9 06/06/2014   PLT 260 06/06/2014   Lab Results  Component Value Date   CREATININE 1.10 06/20/2014   BUN 14 06/20/2014   NA 138 06/20/2014   K 3.9 06/20/2014   CL 102 06/20/2014   CO2 27 06/20/2014    Lab Results  Component Value Date   HGBA1C 6.0* 06/04/2014   Lipid Panel  No results found for: CHOL, TRIG, HDL, CHOLHDL, VLDL, LDLCALC     Assessment and plan:   Scott Livingston was seen today for hypertension.  Diagnoses and all orders for this visit:  Acute combined systolic and diastolic congestive heart failure Orders: -     Basic Metabolic Panel -     carvedilol (COREG) 25 MG tablet; Take 1 tablet (25 mg total) by mouth 2 (two) times daily with a meal. -     furosemide (LASIX) 40 MG tablet; Take 1 tablet (40 mg total) by mouth 2 (two) times daily. -     isosorbide-hydrALAZINE (BIDIL) 20-37.5 MG per tablet;  Take 1 tablet by mouth 3 (three) times daily. -     lisinopril (PRINIVIL,ZESTRIL) 20 MG tablet; Take 1 tablet (20 mg total) by mouth daily. Medications refilled. I have went over the importance of daily medication compliance and potential complications of non-compliance. I have advised patient to call back to schedule his follow up with Cardiology and stress testing  Obesity, Class III, BMI 40-49.9 (morbid obesity) Stressed weight loss to patient in great detail. Went over benefits on knees and his heart  Bilateral edema of lower extremity Take lasix as directed. Weight loss  Osteoarthritis of both knees, unspecified osteoarthritis type Orders: -     traMADol (ULTRAM) 50 MG tablet; May take 1 tablet twice daily as needed for pain Weight loss  Return in about 3 months (around 11/27/2014) for routine .        Holland CommonsKECK, Ksean Vale, NP-C [facility] [phone] 08/27/2014, 3:45 PM '

## 2014-08-27 NOTE — Progress Notes (Signed)
Patient here to follow up on his HTN-he reports he does not take his Lasix like he is supposed to. Patient needs refill on Tramadol Patient reports chronic knee pain  Patient reports edema of lower extremities

## 2014-08-27 NOTE — Patient Instructions (Signed)

## 2014-09-21 ENCOUNTER — Ambulatory Visit: Payer: Self-pay | Attending: Internal Medicine

## 2014-10-11 ENCOUNTER — Encounter: Payer: Self-pay | Admitting: Cardiology

## 2014-10-11 ENCOUNTER — Ambulatory Visit (INDEPENDENT_AMBULATORY_CARE_PROVIDER_SITE_OTHER): Payer: No Typology Code available for payment source | Admitting: Cardiology

## 2014-10-11 ENCOUNTER — Encounter: Payer: Self-pay | Admitting: *Deleted

## 2014-10-11 VITALS — BP 130/70 | HR 78 | Ht 71.0 in | Wt 331.0 lb

## 2014-10-11 DIAGNOSIS — I5043 Acute on chronic combined systolic (congestive) and diastolic (congestive) heart failure: Secondary | ICD-10-CM

## 2014-10-11 DIAGNOSIS — G4730 Sleep apnea, unspecified: Secondary | ICD-10-CM

## 2014-10-11 DIAGNOSIS — I10 Essential (primary) hypertension: Secondary | ICD-10-CM

## 2014-10-11 DIAGNOSIS — Z01812 Encounter for preprocedural laboratory examination: Secondary | ICD-10-CM

## 2014-10-11 DIAGNOSIS — I5041 Acute combined systolic (congestive) and diastolic (congestive) heart failure: Secondary | ICD-10-CM

## 2014-10-11 LAB — CBC WITH DIFFERENTIAL/PLATELET
Basophils Absolute: 0 10*3/uL (ref 0.0–0.1)
Basophils Relative: 0.3 % (ref 0.0–3.0)
Eosinophils Absolute: 0.3 10*3/uL (ref 0.0–0.7)
Eosinophils Relative: 2.9 % (ref 0.0–5.0)
HCT: 42.5 % (ref 39.0–52.0)
Hemoglobin: 13.9 g/dL (ref 13.0–17.0)
Lymphocytes Relative: 19 % (ref 12.0–46.0)
Lymphs Abs: 1.8 10*3/uL (ref 0.7–4.0)
MCHC: 32.7 g/dL (ref 30.0–36.0)
MCV: 81.1 fl (ref 78.0–100.0)
Monocytes Absolute: 0.5 10*3/uL (ref 0.1–1.0)
Monocytes Relative: 5.4 % (ref 3.0–12.0)
Neutro Abs: 7 10*3/uL (ref 1.4–7.7)
Neutrophils Relative %: 72.4 % (ref 43.0–77.0)
Platelets: 232 10*3/uL (ref 150.0–400.0)
RBC: 5.24 Mil/uL (ref 4.22–5.81)
RDW: 16.8 % — ABNORMAL HIGH (ref 11.5–15.5)
WBC: 9.6 10*3/uL (ref 4.0–10.5)

## 2014-10-11 LAB — COMPREHENSIVE METABOLIC PANEL
ALT: 23 U/L (ref 0–53)
AST: 20 U/L (ref 0–37)
Albumin: 4 g/dL (ref 3.5–5.2)
Alkaline Phosphatase: 68 U/L (ref 39–117)
BUN: 17 mg/dL (ref 6–23)
CO2: 30 mEq/L (ref 19–32)
Calcium: 9.5 mg/dL (ref 8.4–10.5)
Chloride: 101 mEq/L (ref 96–112)
Creatinine, Ser: 1.18 mg/dL (ref 0.40–1.50)
GFR: 87.14 mL/min (ref >60.00)
Glucose, Bld: 80 mg/dL (ref 70–99)
Potassium: 3.6 mEq/L (ref 3.5–5.1)
Sodium: 140 mEq/L (ref 135–145)
Total Bilirubin: 0.4 mg/dL (ref 0.2–1.2)
Total Protein: 7.5 g/dL (ref 6.0–8.3)

## 2014-10-11 LAB — PROTIME-INR
INR: 1 ratio (ref 0.8–1.0)
Prothrombin Time: 11 s (ref 9.6–13.1)

## 2014-10-11 LAB — BRAIN NATRIURETIC PEPTIDE: Pro B Natriuretic peptide (BNP): 60 pg/mL (ref 0.0–100.0)

## 2014-10-11 MED ORDER — ASPIRIN EC 81 MG PO TBEC: 81.0000 mg | DELAYED_RELEASE_TABLET | Freq: Every day | ORAL | Status: DC

## 2014-10-11 MED ORDER — FUROSEMIDE 40 MG PO TABS: 80.0000 mg | ORAL_TABLET | Freq: Two times a day (BID) | ORAL | Status: DC

## 2014-10-11 MED ORDER — METOLAZONE 2.5 MG PO TABS: 2.5000 mg | ORAL_TABLET | Freq: Every day | ORAL | Status: DC

## 2014-10-11 MED ORDER — SPIRONOLACTONE 25 MG PO TABS: 25.0000 mg | ORAL_TABLET | Freq: Every day | ORAL | Status: DC

## 2014-10-11 NOTE — Progress Notes (Signed)
Patient ID: Scott Livingston, male   DOB: Oct 11, 1970, 44 y.o.   MRN: [id number] Patient ID: [name], male   DOB: July 15, 1970, 44 y.o.   MRN: [id number]    Patient Name: Jacari Iannello Date of Encounter: 10/11/2014  Primary Care Provider:  Holland Commons, NP Primary Cardiologist: Lars Masson  Problem List   Past Medical History  Diagnosis Date  . Hypertension   . Hypercholesteremia   . Shortness of breath dyspnea   . Myocardial infarction ~ 2010    "mild; I lived in [place]"  . Sleep apnea     "never received a mask" (06/05/2014)  . Headache     "maybe monthly" (06/05/2014)  . Arthritis     "knees" (06/05/2014)  . Acute CHF (congestive heart failure)     [name] 06/05/2014   Past Surgical History  Procedure Laterality Date  . No past surgeries      Allergies  No Known Allergies  HPI  44 y.o. male with past medical history of hypertension, obesity and hypercholesterolemia, who presented to urgent care with shortness of breath, orthopnea proximal nocturnal dyspnea or lower extremity edema and was diagnosed with hypertensive urgency. The patient was admitted and started on multiple medication including BiDil, carvedilol, lisinopril and furosemide he was treated for congestive heart failure was found to have decreased LV ejection fraction of 40% that was believed secondary to poorly controlled hypertension. Patient also had mildly elevated troponin at 0.06 believed to be secondary to hypertensive urgency and heart failure. He is coming today and states that he feels significantly better with resolved lower extremity edema, improved orthopnea and no paroxysmal nocturnal dyspnea his dyspnea on exertion has improved. 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 He denies claudications, palpitations or syncope. The patient is a former smoker quit in 2011 and has no family history of premature coronary artery disease.  10/11/2014 - the  patient is coming after 4 months, he is complaining of worsening DOE, orthopnea, PND and worsening LE edema, weight gain. He works at the [employer] and is unable to work whole 8 hour shifts. He increased his lasix to 120 mg in the am and 80 mg in the pm.  Home Medications  Prior to Admission medications   Medication Sig Start Date End Date Taking? Authorizing Provider  carvedilol (COREG) 25 MG tablet Take 1 tablet (25 mg total) by mouth 2 (two) times daily with a meal. 06/06/14  Yes Vassie Loll, MD  furosemide (LASIX) 40 MG tablet Take 1 tablet (40 mg total) by mouth 2 (two) times daily. 06/06/14  Yes Vassie Loll, MD  isosorbide-hydrALAZINE (BIDIL) 20-37.5 MG per tablet Take 1 tablet by mouth 3 (three) times daily. 06/06/14  Yes Vassie Loll, MD  lisinopril (PRINIVIL,ZESTRIL) 20 MG tablet Take 1 tablet (20 mg total) by mouth daily. 06/06/14  Yes Vassie Loll, MD  celecoxib (CELEBREX) 200 MG capsule Take 200 mg by mouth daily.    Historical Provider, MD  traMADol (ULTRAM) 50 MG tablet Take 50 mg by mouth every 6 (six) hours as needed for moderate pain.    Historical Provider, MD    Family History  Family History  Problem Relation Age of Onset  . Diabetes Father   . Hypertension Mother   . Congestive Heart Failure Father     Social History  History   Social History  . Marital Status: Single    Spouse Name: N/A  . Number of  Children: N/A  . Years of Education: N/A   Occupational History  . Not on file.   Social History Main Topics  . Smoking status: Former Smoker -- 0.12 packs/day for 20 years    Types: Cigarettes  . Smokeless tobacco: Never Used     Comment: "stopped smoking in ~ 2010-2011"  . Alcohol Use: Yes     Comment: 06/05/2014 "might have a few drinks/month"  . Drug Use: No  . Sexual Activity: Not Currently   Other Topics Concern  . Not on file   Social History Narrative     Review of Systems, as per HPI, otherwise negative General:  No chills, fever,  night sweats or weight changes.  Cardiovascular:  No chest pain, dyspnea on exertion, edema, orthopnea, palpitations, paroxysmal nocturnal dyspnea. Dermatological: No rash, lesions/masses Respiratory: No cough, dyspnea Urologic: No hematuria, dysuria Abdominal:   No nausea, vomiting, diarrhea, bright red blood per rectum, melena, or hematemesis Neurologic:  No visual changes, wkns, changes in mental status. All other systems reviewed and are otherwise negative except as noted above.  Physical Exam  Blood pressure 130/70, pulse 78, height 5\' 11"  (1.803 m), weight 331 lb (150.141 kg), SpO2 95 %.  General: Pleasant, NAD, morbidly obese Psych: Normal affect. Neuro: Alert and oriented X 3. Moves all extremities spontaneously. HEENT: Normal  Neck: Supple without bruits or JVD. Lungs:  Resp regular and unlabored, CTA. Heart: RRR no s3, s4, or murmurs. Abdomen: Soft, non-tender, non-distended, BS + x 4.  Extremities: No clubbing, cyanosis or edema. DP/PT/Radials 2+ and equal bilaterally.  Labs:  No results for input(s): CKTOTAL, CKMB, TROPONINI in the last 72 hours. Lab Results  Component Value Date   WBC 6.4 06/06/2014   HGB 14.0 06/06/2014   HCT 43.5 06/06/2014   MCV 80.9 06/06/2014   PLT 260 06/06/2014    No results found for: DDIMER Invalid input(s): POCBNP    Component Value Date/Time   NA 138 06/20/2014 0748   K 3.9 06/20/2014 0748   CL 102 06/20/2014 0748   CO2 27 06/20/2014 0748   GLUCOSE 109* 06/20/2014 0748   BUN 14 06/20/2014 0748   CREATININE 1.10 06/20/2014 0748   CALCIUM 9.0 06/20/2014 0748   PROT 6.9 06/20/2014 0748   ALBUMIN 3.9 06/20/2014 0748   AST 21 06/20/2014 0748   ALT 29 06/20/2014 0748   ALKPHOS 66 06/20/2014 0748   BILITOT 0.4 06/20/2014 0748   GFRNONAA >90 06/06/2014 0440   GFRAA >90 06/06/2014 0440   No results found for: CHOL, HDL, LDLCALC, TRIG  Accessory Clinical Findings  Echocardiogram - 06/05/2014 Left ventricle: The cavity size was  normal. Wall thickness was normal. Systolic function was mildly to moderately reduced. The estimated ejection fraction was in the range of 40% to 45%. Diffuse hypokinesis. Doppler parameters are consistent with a reversible restrictive pattern, indicative of decreased left ventricular diastolic compliance and/or increased left atrial pressure (grade 3 diastolic dysfunction). - Mitral valve: There was mild regurgitation. - Left atrium: The atrium was moderately dilated.  ECG - SR, LVH with repolarization abnormalities    Assessment & Plan  44 year old male   1-acute on chronic combined systolic/diastolic heart failure: originally the patient was admitted with hypertensive urgency, his CHF was believed to be sec to long standing hypertension. He had mildly elevated troponin on admission that was believed to be sec to CHF. He was started on lisinopril, carvedilol and BiDil. He improved at first but now his symptoms are worsening despite  good BO control and increased diuretic dosages. He was scheduled for a cardiac MRI however didn't fit the scanner. I am concern that because of his size his nuclear study will be heavily affected by artifacts. Also, he has grade 3 diastolic dysfunction with normal wall thickness at age 42 years. Hemochromatosis should be excluded (unfortunately didn't fit MRI scanner).   I will schedule right and left cardiac cath. Change lasix to 80 mg po BID. Add metolazone 2.5 mg po daily. Add spironolactone 25 mg po daily.   2. Cardiomyopathy - of unknown etiology, this is possibly secondary to poorly controlled long-standing hypertension, however ischemia needs to be rule out to specifically with symptoms of exertional chest pain.   3. Accelerated HTN:  - Controlled on carvedilol, lisinopril, bidil and lasix - advise to follow low sodium diet  4-obesity: low calorie diet and exercise discussed with patient  5. Possible sleep apnea - we will refer for a  sleep study  Follow up in 3 months  Lars Masson, MD, [facility] 10/11/2014, 3:00 PM

## 2014-10-11 NOTE — Patient Instructions (Addendum)
Medication Instructions:   INCREASE YOUR LASIX TO 80 MG TWICE DAILY--PLEASE FOLLOW INSTRUCTIONS ON THE BOTTLE  START TAKING METOLAZONE 2.5 MG ONCE DAILY  START TAKING SPIRONOLACTONE 25 MG ONCE DAILY  START TAKING ASPIRIN 81 MG ONCE DAILY    Labwork:  TODAY---PRE-CATH LABS---CMET, BNP, CBC W DIFF, PT/INR    Testing/Procedures:  Your physician has requested that you have a cardiac catheterization. Cardiac catheterization is used to diagnose and/or treat various heart conditions. Doctors may recommend this procedure for a number of different reasons. The most common reason is to evaluate chest pain. Chest pain can be a symptom of coronary artery disease (CAD), and cardiac catheterization can show whether plaque is narrowing or blocking your heart's arteries. This procedure is also used to evaluate the valves, as well as measure the blood flow and oxygen levels in different parts of your heart. For further information please visit [email or web address] Please follow instruction sheet, as given.  YOUR CATH IS SCHEDULED FOR NEXT Thursday 10/18/14 AT 7:30 AM WITH DR HARDING AT [place]---PLEASE ARRIVE AT 5:30 AM THE DAY OF YOUR CATH   Your physician has recommended that you have a sleep study. This test records several body functions during sleep, including: brain activity, eye movement, oxygen and carbon dioxide blood levels, heart rate and rhythm, breathing rate and rhythm, the flow of air through your mouth and nose, snoring, body muscle movements, and chest and belly movement.     Follow-Up:   3 MONTHS WITH DR Delton SeeNELSON

## 2014-10-17 DIAGNOSIS — R079 Chest pain, unspecified: Secondary | ICD-10-CM | POA: Diagnosis present

## 2014-10-17 NOTE — H&P (View-Only) (Signed)
Patient ID: Scott Livingston, male   DOB: Oct 11, 1970, 44 y.o.   MRN: [id number] Patient ID: Scott Livingston, male   DOB: July 15, 1970, 44 y.o.   MRN: [id number]    Patient Name: Scott Livingston Date of Encounter: 10/11/2014  Primary Care Provider:  Holland Commons, NP Primary Cardiologist: Lars Masson  Problem List   Past Medical History  Diagnosis Date  . Hypertension   . Hypercholesteremia   . Shortness of breath dyspnea   . Myocardial infarction ~ 2010    "mild; I lived in [place]"  . Sleep apnea     "never received a mask" (06/05/2014)  . Headache     "maybe monthly" (06/05/2014)  . Arthritis     "knees" (06/05/2014)  . Acute CHF (congestive heart failure)     [name] 06/05/2014   Past Surgical History  Procedure Laterality Date  . No past surgeries      Allergies  No Known Allergies  HPI  44 y.o. male with past medical history of hypertension, obesity and hypercholesterolemia, who presented to urgent care with shortness of breath, orthopnea proximal nocturnal dyspnea or lower extremity edema and was diagnosed with hypertensive urgency. The patient was admitted and started on multiple medication including BiDil, carvedilol, lisinopril and furosemide he was treated for congestive heart failure was found to have decreased LV ejection fraction of 40% that was believed secondary to poorly controlled hypertension. Patient also had mildly elevated troponin at 0.06 believed to be secondary to hypertensive urgency and heart failure. He is coming today and states that he feels significantly better with resolved lower extremity edema, improved orthopnea and no paroxysmal nocturnal dyspnea his dyspnea on exertion has improved. 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 He denies claudications, palpitations or syncope. The patient is a former smoker quit in 2011 and has no family history of premature coronary artery disease.  10/11/2014 - the  patient is coming after 4 months, he is complaining of worsening DOE, orthopnea, PND and worsening LE edema, weight gain. He works at the [employer] and is unable to work whole 8 hour shifts. He increased his lasix to 120 mg in the am and 80 mg in the pm.  Home Medications  Prior to Admission medications   Medication Sig Start Date End Date Taking? Authorizing Provider  carvedilol (COREG) 25 MG tablet Take 1 tablet (25 mg total) by mouth 2 (two) times daily with a meal. 06/06/14  Yes Vassie Loll, MD  furosemide (LASIX) 40 MG tablet Take 1 tablet (40 mg total) by mouth 2 (two) times daily. 06/06/14  Yes Vassie Loll, MD  isosorbide-hydrALAZINE (BIDIL) 20-37.5 MG per tablet Take 1 tablet by mouth 3 (three) times daily. 06/06/14  Yes Vassie Loll, MD  lisinopril (PRINIVIL,ZESTRIL) 20 MG tablet Take 1 tablet (20 mg total) by mouth daily. 06/06/14  Yes Vassie Loll, MD  celecoxib (CELEBREX) 200 MG capsule Take 200 mg by mouth daily.    Historical Provider, MD  traMADol (ULTRAM) 50 MG tablet Take 50 mg by mouth every 6 (six) hours as needed for moderate pain.    Historical Provider, MD    Family History  Family History  Problem Relation Age of Onset  . Diabetes Father   . Hypertension Mother   . Congestive Heart Failure Father     Social History  History   Social History  . Marital Status: Single    Spouse Name: N/A  . Number of  Children: N/A  . Years of Education: N/A   Occupational History  . Not on file.   Social History Main Topics  . Smoking status: Former Smoker -- 0.12 packs/day for 20 years    Types: Cigarettes  . Smokeless tobacco: Never Used     Comment: "stopped smoking in ~ 2010-2011"  . Alcohol Use: Yes     Comment: 06/05/2014 "might have a few drinks/month"  . Drug Use: No  . Sexual Activity: Not Currently   Other Topics Concern  . Not on file   Social History Narrative     Review of Systems, as per HPI, otherwise negative General:  No chills, fever,  night sweats or weight changes.  Cardiovascular:  No chest pain, dyspnea on exertion, edema, orthopnea, palpitations, paroxysmal nocturnal dyspnea. Dermatological: No rash, lesions/masses Respiratory: No cough, dyspnea Urologic: No hematuria, dysuria Abdominal:   No nausea, vomiting, diarrhea, bright red blood per rectum, melena, or hematemesis Neurologic:  No visual changes, wkns, changes in mental status. All other systems reviewed and are otherwise negative except as noted above.  Physical Exam  Blood pressure 130/70, pulse 78, height 5\' 11"  (1.803 m), weight 331 lb (150.141 kg), SpO2 95 %.  General: Pleasant, NAD, morbidly obese Psych: Normal affect. Neuro: Alert and oriented X 3. Moves all extremities spontaneously. HEENT: Normal  Neck: Supple without bruits or JVD. Lungs:  Resp regular and unlabored, CTA. Heart: RRR no s3, s4, or murmurs. Abdomen: Soft, non-tender, non-distended, BS + x 4.  Extremities: No clubbing, cyanosis or edema. DP/PT/Radials 2+ and equal bilaterally.  Labs:  No results for input(s): CKTOTAL, CKMB, TROPONINI in the last 72 hours. Lab Results  Component Value Date   WBC 6.4 06/06/2014   HGB 14.0 06/06/2014   HCT 43.5 06/06/2014   MCV 80.9 06/06/2014   PLT 260 06/06/2014    No results found for: DDIMER Invalid input(s): POCBNP    Component Value Date/Time   NA 138 06/20/2014 0748   K 3.9 06/20/2014 0748   CL 102 06/20/2014 0748   CO2 27 06/20/2014 0748   GLUCOSE 109* 06/20/2014 0748   BUN 14 06/20/2014 0748   CREATININE 1.10 06/20/2014 0748   CALCIUM 9.0 06/20/2014 0748   PROT 6.9 06/20/2014 0748   ALBUMIN 3.9 06/20/2014 0748   AST 21 06/20/2014 0748   ALT 29 06/20/2014 0748   ALKPHOS 66 06/20/2014 0748   BILITOT 0.4 06/20/2014 0748   GFRNONAA >90 06/06/2014 0440   GFRAA >90 06/06/2014 0440   No results found for: CHOL, HDL, LDLCALC, TRIG  Accessory Clinical Findings  Echocardiogram - 06/05/2014 Left ventricle: The cavity size was  normal. Wall thickness was normal. Systolic function was mildly to moderately reduced. The estimated ejection fraction was in the range of 40% to 45%. Diffuse hypokinesis. Doppler parameters are consistent with a reversible restrictive pattern, indicative of decreased left ventricular diastolic compliance and/or increased left atrial pressure (grade 3 diastolic dysfunction). - Mitral valve: There was mild regurgitation. - Left atrium: The atrium was moderately dilated.  ECG - SR, LVH with repolarization abnormalities    Assessment & Plan  44 year old male   1-acute on chronic combined systolic/diastolic heart failure: originally the patient was admitted with hypertensive urgency, his CHF was believed to be sec to long standing hypertension. He had mildly elevated troponin on admission that was believed to be sec to CHF. He was started on lisinopril, carvedilol and BiDil. He improved at first but now his symptoms are worsening despite  good BO control and increased diuretic dosages. He was scheduled for a cardiac MRI however didn't fit the scanner. I am concern that because of his size his nuclear study will be heavily affected by artifacts. Also, he has grade 3 diastolic dysfunction with normal wall thickness at age 42 years. Hemochromatosis should be excluded (unfortunately didn't fit MRI scanner).   I will schedule right and left cardiac cath. Change lasix to 80 mg po BID. Add metolazone 2.5 mg po daily. Add spironolactone 25 mg po daily.   2. Cardiomyopathy - of unknown etiology, this is possibly secondary to poorly controlled long-standing hypertension, however ischemia needs to be rule out to specifically with symptoms of exertional chest pain.   3. Accelerated HTN:  - Controlled on carvedilol, lisinopril, bidil and lasix - advise to follow low sodium diet  4-obesity: low calorie diet and exercise discussed with patient  5. Possible sleep apnea - we will refer for a  sleep study  Follow up in 3 months  Lars Masson, MD, [facility] 10/11/2014, 3:00 PM

## 2014-10-17 NOTE — Interval H&P Note (Signed)
History and Physical Interval Note:  10/17/2014 3:18 PM    AUC for R&LHC - known cardiomyopathy with chest tightness & worsening Sx.  Cardiomyopathies (Right and Left Heart Catheterization OR  Right Heart Catheterization Alone With/Without Left Ventriculography and Coronary Angiography)   Patient Information:    Known or suspected cardiomyopathy with or without heart failure  AUC Score:   A (7)   Indication:   93 Cardiomyopathies (Right and Left Heart Catheterization OR  Right Heart Catheterization Alone With/Without Left Ventriculography and Coronary Angiography)   Patient Information:    Re-evaluation of known cardiomyopathy   Change in clinical status or cardiac exam or to guide therapy  AUC Score:   A (7)   Indication:   94   Scott Livingston W, MD

## 2014-10-18 ENCOUNTER — Ambulatory Visit (HOSPITAL_COMMUNITY)
Admission: RE | Admit: 2014-10-18 | Discharge: 2014-10-18 | Disposition: A | Discharge status: Home / Self Care | Payer: No Typology Code available for payment source | Source: Ambulatory Visit | Attending: Cardiology | Admitting: Cardiology

## 2014-10-18 ENCOUNTER — Encounter (HOSPITAL_COMMUNITY)
Admission: RE | Disposition: A | Discharge status: Home / Self Care | Payer: No Typology Code available for payment source | Source: Ambulatory Visit | Attending: Cardiology

## 2014-10-18 DIAGNOSIS — E780 Pure hypercholesterolemia: Secondary | ICD-10-CM | POA: Insufficient documentation

## 2014-10-18 DIAGNOSIS — R079 Chest pain, unspecified: Secondary | ICD-10-CM

## 2014-10-18 DIAGNOSIS — I429 Cardiomyopathy, unspecified: Secondary | ICD-10-CM | POA: Insufficient documentation

## 2014-10-18 DIAGNOSIS — I272 Other secondary pulmonary hypertension: Secondary | ICD-10-CM | POA: Insufficient documentation

## 2014-10-18 DIAGNOSIS — Z87891 Personal history of nicotine dependence: Secondary | ICD-10-CM | POA: Insufficient documentation

## 2014-10-18 DIAGNOSIS — G4730 Sleep apnea, unspecified: Secondary | ICD-10-CM | POA: Insufficient documentation

## 2014-10-18 DIAGNOSIS — I10 Essential (primary) hypertension: Secondary | ICD-10-CM | POA: Insufficient documentation

## 2014-10-18 DIAGNOSIS — E669 Obesity, unspecified: Secondary | ICD-10-CM | POA: Insufficient documentation

## 2014-10-18 DIAGNOSIS — I5041 Acute combined systolic (congestive) and diastolic (congestive) heart failure: Secondary | ICD-10-CM

## 2014-10-18 DIAGNOSIS — I252 Old myocardial infarction: Secondary | ICD-10-CM | POA: Insufficient documentation

## 2014-10-18 DIAGNOSIS — I5043 Acute on chronic combined systolic (congestive) and diastolic (congestive) heart failure: Secondary | ICD-10-CM | POA: Insufficient documentation

## 2014-10-18 HISTORY — PX: CARDIAC CATHETERIZATION: SHX172

## 2014-10-18 SURGERY — RIGHT/LEFT HEART CATH AND CORONARY ANGIOGRAPHY
Anesthesia: LOCAL

## 2014-10-18 MED ORDER — ASPIRIN 81 MG PO CHEW
CHEWABLE_TABLET | ORAL | Status: AC
  Administered 2014-10-18: 81 mg via ORAL
  Filled 2014-10-18: qty 1

## 2014-10-18 MED ORDER — ISOSORB DINITRATE-HYDRALAZINE 20-37.5 MG PO TABS
1.0000 | ORAL_TABLET | Freq: Three times a day (TID) | ORAL | Status: DC
Start: 2014-10-18 — End: 2014-10-18

## 2014-10-18 MED ORDER — LISINOPRIL 20 MG PO TABS: 20.0000 mg | ORAL_TABLET | Freq: Every day | ORAL | Status: DC

## 2014-10-18 MED ORDER — LIDOCAINE HCL (PF) 1 % IJ SOLN
INTRAMUSCULAR | Status: DC | PRN
  Administered 2014-10-18: 08:00:00

## 2014-10-18 MED ORDER — SODIUM CHLORIDE 0.9 % IJ SOLN: 3.0000 mL | INTRAMUSCULAR | Status: DC | PRN

## 2014-10-18 MED ORDER — RADIAL COCKTAIL (HEPARIN/VERAPAMIL/LIDOCAINE/NITRO)
Status: DC | PRN
  Administered 2014-10-18: 1 via INTRA_ARTERIAL

## 2014-10-18 MED ORDER — FENTANYL CITRATE (PF) 100 MCG/2ML IJ SOLN
INTRAMUSCULAR | Status: DC | PRN
  Administered 2014-10-18: 50 ug via INTRAVENOUS

## 2014-10-18 MED ORDER — HEPARIN SODIUM (PORCINE) 1000 UNIT/ML IJ SOLN
INTRAMUSCULAR | Status: DC | PRN
  Administered 2014-10-18: 7000 [IU] via INTRAVENOUS

## 2014-10-18 MED ORDER — LIDOCAINE HCL (PF) 1 % IJ SOLN
INTRAMUSCULAR | Status: AC
  Filled 2014-10-18: qty 30

## 2014-10-18 MED ORDER — ASPIRIN 81 MG PO CHEW
81.0000 mg | CHEWABLE_TABLET | ORAL | Status: AC
  Administered 2014-10-18: 81 mg via ORAL

## 2014-10-18 MED ORDER — ONDANSETRON HCL 4 MG/2ML IJ SOLN: 4.0000 mg | Freq: Four times a day (QID) | INTRAMUSCULAR | Status: DC | PRN

## 2014-10-18 MED ORDER — MIDAZOLAM HCL 2 MG/2ML IJ SOLN
INTRAMUSCULAR | Status: DC | PRN
  Administered 2014-10-18: 2 mg via INTRAVENOUS

## 2014-10-18 MED ORDER — MORPHINE SULFATE 2 MG/ML IJ SOLN: 2.0000 mg | INTRAMUSCULAR | Status: DC | PRN

## 2014-10-18 MED ORDER — NITROGLYCERIN 1 MG/10 ML FOR IR/CATH LAB
INTRA_ARTERIAL | Status: AC
  Filled 2014-10-18: qty 10

## 2014-10-18 MED ORDER — HEPARIN (PORCINE) IN NACL 2-0.9 UNIT/ML-% IJ SOLN
INTRAMUSCULAR | Status: AC
  Filled 2014-10-18: qty 1000

## 2014-10-18 MED ORDER — MIDAZOLAM HCL 2 MG/2ML IJ SOLN
INTRAMUSCULAR | Status: AC
  Filled 2014-10-18: qty 2

## 2014-10-18 MED ORDER — ACETAMINOPHEN 325 MG PO TABS: 650.0000 mg | ORAL_TABLET | ORAL | Status: DC | PRN

## 2014-10-18 MED ORDER — SODIUM CHLORIDE 0.9 % IV SOLN: 250.0000 mL | INTRAVENOUS | Status: DC | PRN

## 2014-10-18 MED ORDER — SODIUM CHLORIDE 0.9 % IJ SOLN: 3.0000 mL | Freq: Two times a day (BID) | INTRAMUSCULAR | Status: DC

## 2014-10-18 MED ORDER — HEPARIN SODIUM (PORCINE) 1000 UNIT/ML IJ SOLN
INTRAMUSCULAR | Status: AC
  Filled 2014-10-18: qty 1

## 2014-10-18 MED ORDER — FENTANYL CITRATE (PF) 100 MCG/2ML IJ SOLN
INTRAMUSCULAR | Status: AC
  Filled 2014-10-18: qty 2

## 2014-10-18 MED ORDER — IOHEXOL 350 MG/ML SOLN
INTRAVENOUS | Status: DC | PRN
  Administered 2014-10-18: 80 mL via INTRA_ARTERIAL

## 2014-10-18 MED ORDER — SODIUM CHLORIDE 0.9 % IV SOLN: INTRAVENOUS | Status: DC

## 2014-10-18 MED ORDER — SODIUM CHLORIDE 0.9 % WEIGHT BASED INFUSION: 3.0000 mL/kg/h | INTRAVENOUS | Status: DC

## 2014-10-18 SURGICAL SUPPLY — 20 items
CATH BALLN WEDGE 5F 110CM (CATHETERS) ×1 IMPLANT
CATH INFINITI 5 FR JL3.5 (CATHETERS) ×1 IMPLANT
CATH INFINITI 5FR ANG PIGTAIL (CATHETERS) ×2 IMPLANT
CATH INFINITI 5FR MULTPACK ANG (CATHETERS) IMPLANT
CATH INFINITI JR4 5F (CATHETERS) ×1 IMPLANT
CATH OPTITORQUE TIG 4.0 5F (CATHETERS) ×1 IMPLANT
CATH SWAN GANZ 7F STRAIGHT (CATHETERS) IMPLANT
DEVICE RAD COMP TR BAND LRG (VASCULAR PRODUCTS) ×2 IMPLANT
GLIDESHEATH SLEND A-KIT 6F 22G (SHEATH) ×2 IMPLANT
KIT HEART LEFT (KITS) ×2 IMPLANT
KIT HEART RIGHT NAMIC (KITS) ×1 IMPLANT
PACK CARDIAC CATHETERIZATION (CUSTOM PROCEDURE TRAY) ×2 IMPLANT
SHEATH FAST CATH BRACH 5F 5CM (SHEATH) ×1 IMPLANT
SHEATH PINNACLE 5F 10CM (SHEATH) IMPLANT
SHEATH PINNACLE 7F 10CM (SHEATH) IMPLANT
SYR MEDRAD MARK V 150ML (SYRINGE) ×2 IMPLANT
TRANSDUCER W/STOPCOCK (MISCELLANEOUS) ×3 IMPLANT
TUBING CIL FLEX 10 FLL-RA (TUBING) ×2 IMPLANT
WIRE EMERALD 3MM-J .035X150CM (WIRE) IMPLANT
WIRE SAFE-T 1.5MM-J .035X260CM (WIRE) ×2 IMPLANT

## 2014-10-18 NOTE — Interval H&P Note (Signed)
History and Physical Interval Note:  10/18/2014 7:08 AM  Scott BundeAnthony Livingston  has presented today for surgery, with the diagnosis of Cardiomyopathy with Acute on Chronic Combined CHF & Chest Pain.   The various methods of treatment have been discussed with the patient and family. After consideration of risks, benefits and other options for treatment, the patient has consented to  Procedure(s): Right/Left Heart Cath and Coronary Angiography (N/A) +/- PERCUTANEOUS CORONARY INTERVENTION as a surgical intervention .  The patient's history has been reviewed, patient examined, no change in status, stable for surgery.  I have reviewed the patient's chart and labs.  Questions were answered to the patient's satisfaction.     Peter Daquila W   Cath Lab Visit (complete for each Cath Lab visit)  Clinical Evaluation Leading to the Procedure:   ACS: No.  Non-ACS:    Anginal Classification: CCS II  Anti-ischemic medical therapy: Maximal Therapy (2 or more classes of medications)  Non-Invasive Test Results: Equivocal test results - Reduced LVEF by Echo  Prior CABG: No previous CABG

## 2014-10-18 NOTE — Progress Notes (Signed)
Site area: rt ac venous sheath Site Prior to Removal:  Level 0 Pressure Applied For: 15 minutes Manual:   yes Patient Status During Pull:  stable Post Pull Site:  Level  0 Post Pull Instructions Given:  yes Post Pull Pulses Present: yes Dressing Applied:  Small tegaderm Bedrest begins @  0910 Comments:  none

## 2014-10-18 NOTE — Discharge Instructions (Signed)
Radial Site Care °Refer to this sheet in the next few weeks. These instructions provide you with information on caring for yourself after your procedure. Your caregiver may also give you more specific instructions. Your treatment has been planned according to current medical practices, but problems sometimes occur. Call your caregiver if you have any problems or questions after your procedure. °HOME CARE INSTRUCTIONS °· You may shower the day after the procedure. Remove the bandage (dressing) and gently wash the site with plain soap and water. Gently pat the site dry. °· Do not apply powder or lotion to the site. °· Do not submerge the affected site in water for 3 to 5 days. °· Inspect the site at least twice daily. °· Do not flex or bend the affected arm for 24 hours. °· No lifting over 5 pounds (2.3 kg) for 5 days after your procedure. °· Do not drive home if you are discharged the same day of the procedure. Have someone else drive you. °· You may drive 24 hours after the procedure unless otherwise instructed by your caregiver. °· Do not operate machinery or power tools for 24 hours. °· A responsible adult should be with you for the first 24 hours after you arrive home. °What to expect: °· Any bruising will usually fade within 1 to 2 weeks. °· Blood that collects in the tissue (hematoma) may be painful to the touch. It should usually decrease in size and tenderness within 1 to 2 weeks. °SEEK IMMEDIATE MEDICAL CARE IF: °· You have unusual pain at the radial site. °· You have redness, warmth, swelling, or pain at the radial site. °· You have drainage (other than a small amount of blood on the dressing). °· You have chills. °· You have a fever or persistent symptoms for more than 72 hours. °· You have a fever and your symptoms suddenly get worse. °· Your arm becomes pale, cool, tingly, or numb. °· You have heavy bleeding from the site. Hold pressure on the site. °Document Released: 05/02/2010 Document Revised:  06/22/2011 Document Reviewed: 05/02/2010 °ExitCare® Patient Information ©2015 [employer]. This information is not intended to replace advice given to you by your health care provider. Make sure you discuss any questions you have with your health care provider. ° °

## 2014-10-19 ENCOUNTER — Encounter (HOSPITAL_COMMUNITY): Payer: Self-pay | Admitting: Cardiology

## 2014-10-19 LAB — POCT I-STAT 3, ART BLOOD GAS (G3+)
ACID-BASE EXCESS: 1 mmol/L (ref 0.0–2.0)
Bicarbonate: 26 mEq/L — ABNORMAL HIGH (ref 20.0–24.0)
O2 Saturation: 96 %
TCO2: 27 mmol/L (ref 0–100)
pCO2 arterial: 40.5 mmHg (ref 35.0–45.0)
pH, Arterial: 7.415 (ref 7.350–7.450)
pO2, Arterial: 79 mmHg — ABNORMAL LOW (ref 80.0–100.0)

## 2014-10-19 LAB — POCT I-STAT 3, VENOUS BLOOD GAS (G3P V)
Acid-Base Excess: 1 mmol/L (ref 0.0–2.0)
BICARBONATE: 27 meq/L — AB (ref 20.0–24.0)
O2 Saturation: 71 %
PCO2 VEN: 46.5 mmHg (ref 45.0–50.0)
PH VEN: 7.372 — AB (ref 7.250–7.300)
TCO2: 28 mmol/L (ref 0–100)
pO2, Ven: 39 mmHg (ref 30.0–45.0)

## 2014-10-22 MED FILL — Nitroglycerin IV Soln 100 MCG/ML in D5W: INTRA_ARTERIAL | Qty: 10 | Status: AC

## 2014-10-25 ENCOUNTER — Ambulatory Visit: Payer: No Typology Code available for payment source | Attending: Internal Medicine

## 2014-10-30 ENCOUNTER — Encounter: Payer: Self-pay | Admitting: Internal Medicine

## 2014-10-30 ENCOUNTER — Ambulatory Visit: Payer: No Typology Code available for payment source | Attending: Internal Medicine | Admitting: Internal Medicine

## 2014-10-30 VITALS — BP 113/76 | HR 87 | Temp 98.7°F | Resp 16 | Ht 71.0 in | Wt 333.0 lb

## 2014-10-30 DIAGNOSIS — M25561 Pain in right knee: Secondary | ICD-10-CM | POA: Insufficient documentation

## 2014-10-30 DIAGNOSIS — Z6841 Body Mass Index (BMI) 40.0 and over, adult: Secondary | ICD-10-CM | POA: Insufficient documentation

## 2014-10-30 DIAGNOSIS — M25562 Pain in left knee: Secondary | ICD-10-CM | POA: Insufficient documentation

## 2014-10-30 NOTE — Progress Notes (Signed)
Patient ID: Scott Livingston, male   DOB: 11/28/1970, 44 y.o.   MRN: [id number]  CC: bilateral knee pain   HPI: Scott Livingston is a 44 y.o. male here today for a follow up visit.  Patient has past medical history of CHF, HTN, HLD, and sleep apnea. Today he reports that he has continued to have bilateral knee pain. Patient states that his left knee is worse than the right. He has not lost weight and the Tramadol has not helped his pain. He reports that in 2013 he had bilateral knee x-[place] in [place] that revealed a diagnoses of Osteoarthritis. He has not had imaging since then. He reports that he has applied for disability for his knee pain. His pain is described as a aching and throbbing.   Patient has No headache, No chest pain, No abdominal pain - No Nausea, No new weakness tingling or numbness, No Cough - SOB.  No Known Allergies Past Medical History  Diagnosis Date  . Hypertension   . Hypercholesteremia   . Shortness of breath dyspnea   . Myocardial infarction ~ 2010    "mild; I lived in [place]"  . Sleep apnea     "never received a mask" (06/05/2014)  . Headache     "maybe monthly" (06/05/2014)  . Arthritis     "knees" (06/05/2014)  . Acute CHF (congestive heart failure)     [name]/notes 06/05/2014   Current Outpatient Prescriptions on File Prior to Visit  Medication Sig Dispense Refill  . aspirin EC 81 MG tablet Take 1 tablet (81 mg total) by mouth daily. 90 tablet 3  . carvedilol (COREG) 25 MG tablet Take 1 tablet (25 mg total) by mouth 2 (two) times daily with a meal. (Patient taking differently: Take 25 mg by mouth every evening. ) 60 tablet 3  . furosemide (LASIX) 40 MG tablet Take 2 tablets (80 mg total) by mouth 2 (two) times daily. 360 tablet 3  . isosorbide-hydrALAZINE (BIDIL) 20-37.5 MG per tablet Take 1 tablet by mouth 3 (three) times daily. 90 tablet 3  . lisinopril (PRINIVIL,ZESTRIL) 20 MG tablet Take 1 tablet (20 mg total) by mouth daily. 30 tablet 3  . metolazone (ZAROXOLYN) 2.5 MG  tablet Take 1 tablet (2.5 mg total) by mouth daily. 90 tablet 3  . potassium gluconate 595 MG TABS tablet Take 595 mg by mouth 2 (two) times daily.     Marland Kitchen. spironolactone (ALDACTONE) 25 MG tablet Take 1 tablet (25 mg total) by mouth daily. 90 tablet 3  . traMADol (ULTRAM) 50 MG tablet May take 1 tablet twice daily as needed for pain (Patient not taking: Reported on 10/30/2014) 60 tablet 0   No current facility-administered medications on file prior to visit.   Family History  Problem Relation Age of Onset  . Diabetes Father   . Hypertension Mother   . Congestive Heart Failure Father    History   Social History  . Marital Status: Single    Spouse Name: N/A  . Number of Children: N/A  . Years of Education: N/A   Occupational History  . Not on file.   Social History Main Topics  . Smoking status: Former Smoker -- 0.12 packs/day for 20 years    Types: Cigarettes  . Smokeless tobacco: Never Used     Comment: "stopped smoking in ~ 2010-2011"  . Alcohol Use: Yes     Comment: 06/05/2014 "might have a few drinks/month"  . Drug Use: No  . Sexual Activity: Not  Currently   Other Topics Concern  . Not on file   Social History Narrative    Review of Systems: See HPI   Objective:   Filed Vitals:   10/30/14 1624  BP: 113/76  Pulse: 87  Temp: 98.7 F (37.1 C)  Resp: 16    Physical Exam  Cardiovascular: Normal rate, regular rhythm and normal heart sounds.   Pulmonary/Chest: Effort normal and breath sounds normal.  Musculoskeletal: Normal range of motion. He exhibits no edema.  Skin: Skin is warm and dry.    Lab Results  Component Value Date   WBC 9.6 10/11/2014   HGB 13.9 10/11/2014   HCT 42.5 10/11/2014   MCV 81.1 10/11/2014   PLT 232.0 10/11/2014   Lab Results  Component Value Date   CREATININE 1.18 10/11/2014   BUN 17 10/11/2014   NA 140 10/11/2014   K 3.6 10/11/2014   CL 101 10/11/2014   CO2 30 10/11/2014    Lab Results  Component Value Date   HGBA1C  6.0* 06/04/2014   Lipid Panel  No results found for: CHOL, TRIG, HDL, CHOLHDL, VLDL, LDLCALC     Assessment and plan:   Scott Livingston was seen today for knee pain.  Diagnoses and all orders for this visit:  Bilateral knee pain Orders: -     Basic metabolic panel Patient will need a Ortho referral when he gets hospital doscount. Weight loss highly advised.   Morbid obesity with BMI of 45.0-49.9, adult Stressed to the patient again the importance of losing weight to relieve some of the stress on his knee joints    Return in about 3 months (around 01/30/2015) for Hypertension.       Ambrose Finland, NP-C [facility] [phone] 10/30/2014, 4:52 PM

## 2014-10-30 NOTE — Progress Notes (Signed)
Patient here for pain in both knees. Pain rated at a 5 in the right knee and a 10 in the left knee. Pain has been going on for 3 years now due to arthritis. Patient describes pain as throbbing and tight. Pain is constant. Patient reports tramadol is not helping the pain so he is no longer taking it. Patient has taken his medications for today and does not need any refills on his medications.

## 2014-10-30 NOTE — Patient Instructions (Signed)
Please apply for hospital discount so that I may send referral for Orthopedics

## 2014-10-31 DIAGNOSIS — Z6841 Body Mass Index (BMI) 40.0 and over, adult: Secondary | ICD-10-CM

## 2014-10-31 LAB — BASIC METABOLIC PANEL
BUN: 19 mg/dL (ref 6–23)
CHLORIDE: 101 meq/L (ref 96–112)
CO2: 24 mEq/L (ref 19–32)
CREATININE: 1.21 mg/dL (ref 0.50–1.35)
Calcium: 10.1 mg/dL (ref 8.4–10.5)
GLUCOSE: 87 mg/dL (ref 70–99)
POTASSIUM: 4 meq/L (ref 3.5–5.3)
Sodium: 141 mEq/L (ref 135–145)

## 2014-11-01 ENCOUNTER — Telehealth: Payer: Self-pay | Admitting: Internal Medicine

## 2014-11-01 NOTE — Telephone Encounter (Signed)
Patient came into office requesting letter for foos stamps, stating dx . Please f/u with patient

## 2014-11-02 NOTE — Telephone Encounter (Signed)
May write letter stating patient may work but he may require a job that will allow him to sit at least every 2-3 hours due to swelling in legs

## 2014-11-02 NOTE — Telephone Encounter (Signed)
Nurse called patient, patient verified date of birth. Patient aware of normal labs. Patient is unable to work because of legs and is requesting letter for food stamps explaining he is under the providers care.

## 2014-11-02 NOTE — Telephone Encounter (Signed)
-----   Message from Ambrose Finland, NP sent at 10/31/2014  7:06 PM EDT ----- Labs are normal

## 2014-11-06 ENCOUNTER — Ambulatory Visit: Payer: No Typology Code available for payment source | Attending: Internal Medicine

## 2014-11-06 ENCOUNTER — Telehealth: Payer: Self-pay | Admitting: Internal Medicine

## 2014-11-06 NOTE — Telephone Encounter (Signed)
Patient came into facility to inquire about the letter he requested. Please f/u with pt.

## 2014-11-07 ENCOUNTER — Other Ambulatory Visit: Payer: Self-pay | Admitting: Internal Medicine

## 2014-11-07 DIAGNOSIS — I5041 Acute combined systolic (congestive) and diastolic (congestive) heart failure: Secondary | ICD-10-CM

## 2014-11-07 MED ORDER — ISOSORB DINITRATE-HYDRALAZINE 20-37.5 MG PO TABS: 1.0000 | ORAL_TABLET | Freq: Three times a day (TID) | ORAL | Status: DC

## 2015-01-03 ENCOUNTER — Ambulatory Visit (HOSPITAL_BASED_OUTPATIENT_CLINIC_OR_DEPARTMENT_OTHER): Payer: No Typology Code available for payment source | Attending: Cardiology | Admitting: Radiology

## 2015-01-03 DIAGNOSIS — E669 Obesity, unspecified: Secondary | ICD-10-CM | POA: Insufficient documentation

## 2015-01-03 DIAGNOSIS — G4730 Sleep apnea, unspecified: Secondary | ICD-10-CM

## 2015-01-03 DIAGNOSIS — G4719 Other hypersomnia: Secondary | ICD-10-CM | POA: Insufficient documentation

## 2015-01-03 DIAGNOSIS — G4733 Obstructive sleep apnea (adult) (pediatric): Secondary | ICD-10-CM | POA: Insufficient documentation

## 2015-01-03 DIAGNOSIS — I10 Essential (primary) hypertension: Secondary | ICD-10-CM | POA: Insufficient documentation

## 2015-01-03 DIAGNOSIS — Z6841 Body Mass Index (BMI) 40.0 and over, adult: Secondary | ICD-10-CM | POA: Insufficient documentation

## 2015-01-03 DIAGNOSIS — R0683 Snoring: Secondary | ICD-10-CM | POA: Insufficient documentation

## 2015-01-08 ENCOUNTER — Telehealth: Payer: Self-pay | Admitting: Cardiology

## 2015-01-08 NOTE — Telephone Encounter (Signed)
Please let patient know that they have significant sleep apnea and had successful CPAP titration and will be set up with CPAP unit.  Please let DME know that order is in EPIC.  Please set patient up for OV in 10 weeks 

## 2015-01-08 NOTE — Sleep Study (Signed)
Patient Name: Scott Livingston, Manas MRN: [id number] Study Date: 01/03/2015 Gender: Male D.O.B: 11-12-1970 Age (years): 38 Referring Provider: Dorothy Spark Interpreting Physician: Fransico Him MD, ABSM RPSGT: Laren Everts  Weight (lbs): 330 Height (inches): 71 BMI: 46 Neck Size: 18.75  CLINICAL INFORMATION  Sleep Study Type: Split Night CPAP  Indication for sleep study: Excessive Daytime Sleepiness, Hypertension, Obesity, OSA, Witnessed Apneas  Epworth Sleepiness Score: 7  SLEEP STUDY TECHNIQUE As per the AASM Manual for the Scoring of Sleep and Associated Events v2.3 (April 2016) with a hypopnea requiring 4% desaturations.  The channels recorded and monitored were frontal, central and occipital EEG, electrooculogram (EOG), submentalis EMG (chin), nasal and oral airflow, thoracic and abdominal wall motion, anterior tibialis EMG, snore microphone, electrocardiogram, and pulse oximetry. Continuous positive airway pressure (CPAP) was initiated when the patient met split night criteria and was titrated according to treat sleep-disordered breathing.  MEDICATIONS Medications taken by the patient : ASA, Coreg, Lasix, Bidil, Lisinopril, Zaroxolyn, Ultram, potassium, Aldactone Medications administered by patient during sleep study : No sleep medicine administered.  RESPIRATORY PARAMETERS  Diagnostic Total AHI (/hr): 98.6  RDI (/hr):137.3  OA Index (/hr): 2.1  CA Index (/hr): 0.8 REM AHI (/hr): 180.0  NREM AHI (/hr):98.0  Supine AHI (/hr):181.0  Non-supine AHI (/hr): 97.61 Min O2 Sat (%):78.00  Mean O2 (%):91.38  Time below 88% (min):26.5    Titration Optimal Pressure (cm):16  AHI at Optimal Pressure (/hr):0.0 Min O2 at Optimal Pressure (%):93.0 Supine % at Optimal (%):100  Sleep % at Optimal (%):100    SLEEP ARCHITECTURE The recording time for the entire night was 426.5 minutes.  During a baseline period of 210.5 minutes, the patient slept for 145.5 minutes in REM and  nonREM, yielding a reduced sleep efficiency of 69.1%. Sleep onset after lights out was 8.6 minutes with a REM latency of 58.5 minutes. The patient spent 39.52% of the night in stage N1 sleep, 59.79% in stage N2 sleep, 0.00% in stage N3 and 0.69% in REM.  During the titration period of 208.5 minutes, the patient slept for 200.9 minutes in REM and nonREM, yielding a sleep efficiency of 96.4%. Sleep onset after CPAP initiation was 3.6 minutes with a REM latency of 26.5 minutes. The patient spent 7.71% of the night in stage N1 sleep, 74.12% in stage N2 sleep, 0.00% in stage N3 and 18.17% in REM.  CARDIAC DATA The 2 lead EKG demonstrated sinus rhythm. The mean heart rate was 75.79 beats per minute. Other EKG findings include: None.  LEG MOVEMENT DATA The total Periodic Limb Movements of Sleep (PLMS) were 157. The PLMS index was 27.19 .  IMPRESSIONS Severe obstructive sleep apnea occurred during the diagnostic portion of the study (AHI = 98.6/hour). An optimal PAP pressure was selected for this patient ( 16 cm of water) No significant central sleep apnea occurred during the diagnostic portion of the study (CAI = 0.8/hour). Moderate oxygen desaturation was noted during the diagnostic portion of the study (Min O2 =78.00%). The patient snored with Loud snoring volume during the diagnostic portion of the study. No cardiac abnormalities were noted during this study. Mild periodic limb movements of sleep occurred during the study.  DIAGNOSIS Obstructive Sleep Apnea (327.23 [G47.33 ICD-10])  RECOMMENDATIONS Trial of CPAP therapy on 16 cm H2O with a Medium size Fisher&Paykel Full Face Mask Simplus mask and heated humidification. Avoid alcohol, sedatives and other CNS depressants that may worsen sleep apnea and disrupt normal sleep architecture. Sleep hygiene should be reviewed to  assess factors that may improve sleep quality. Weight management and regular exercise should be initiated or continued. Return  to [facility] for re-evaluation after 10 weeks of therapy and download in 4 weeks to assess compliance.  Sueanne Margarita Diplomate, American Board of Sleep Medicine  ELECTRONICALLY SIGNED ON:  01/08/2015, 8:08 PM [place] PH: [phone]   FX: [phone] [place]

## 2015-01-08 NOTE — Addendum Note (Signed)
Addended by: Armanda Magic R on: 01/08/2015 09:09 PM   Modules accepted: Orders

## 2015-01-09 NOTE — Telephone Encounter (Signed)
Left message for patient to call back in regards to sleep study results

## 2015-01-09 NOTE — Telephone Encounter (Signed)
Patient is aware of results. Stated verbal understanding.  [facility] notified of orders.  Once he has been set up with machine, I will schedule 10 week follow-up

## 2015-01-11 ENCOUNTER — Ambulatory Visit (INDEPENDENT_AMBULATORY_CARE_PROVIDER_SITE_OTHER): Payer: No Typology Code available for payment source | Admitting: Cardiology

## 2015-01-11 ENCOUNTER — Encounter: Payer: Self-pay | Admitting: Cardiology

## 2015-01-11 VITALS — BP 142/92 | HR 84 | Ht 71.0 in | Wt 349.0 lb

## 2015-01-11 DIAGNOSIS — I5041 Acute combined systolic (congestive) and diastolic (congestive) heart failure: Secondary | ICD-10-CM

## 2015-01-11 DIAGNOSIS — I5043 Acute on chronic combined systolic (congestive) and diastolic (congestive) heart failure: Secondary | ICD-10-CM

## 2015-01-11 DIAGNOSIS — I10 Essential (primary) hypertension: Secondary | ICD-10-CM

## 2015-01-11 LAB — COMPREHENSIVE METABOLIC PANEL
ALT: 25 U/L (ref 0–53)
AST: 21 U/L (ref 0–37)
Albumin: 3.7 g/dL (ref 3.5–5.2)
Alkaline Phosphatase: 58 U/L (ref 39–117)
BUN: 15 mg/dL (ref 6–23)
CO2: 31 mEq/L (ref 19–32)
Calcium: 9.2 mg/dL (ref 8.4–10.5)
Chloride: 106 mEq/L (ref 96–112)
Creatinine, Ser: 1.03 mg/dL (ref 0.40–1.50)
GFR: 101.82 mL/min (ref >60.00)
Glucose, Bld: 107 mg/dL — ABNORMAL HIGH (ref 70–99)
Potassium: 4.1 mEq/L (ref 3.5–5.1)
Sodium: 142 mEq/L (ref 135–145)
Total Bilirubin: 0.2 mg/dL (ref 0.2–1.2)
Total Protein: 6.8 g/dL (ref 6.0–8.3)

## 2015-01-11 LAB — BRAIN NATRIURETIC PEPTIDE: Pro B Natriuretic peptide (BNP): 113 pg/mL — ABNORMAL HIGH (ref 0.0–100.0)

## 2015-01-11 NOTE — Progress Notes (Signed)
Patient ID: Scott Livingston, male   DOB: 16-Dec-1970, 44 y.o.   MRN: [id number]    Patient Name: Scott Livingston Date of Encounter: 01/11/2015  Primary Care Provider:  Ambrose Finland, NP Primary Cardiologist: Lars Masson  Problem List   Past Medical History  Diagnosis Date  . Hypertension   . Hypercholesteremia   . Shortness of breath dyspnea   . Myocardial infarction ~ 2010    "mild; I lived in [place]"  . Sleep apnea     "never received a mask" (06/05/2014)  . Headache     "maybe monthly" (06/05/2014)  . Arthritis     "knees" (06/05/2014)  . Acute CHF (congestive heart failure)     Scott Livingston 06/05/2014   Past Surgical History  Procedure Laterality Date  . No past surgeries    . Cardiac catheterization N/A 10/18/2014    Procedure: Right/Left Heart Cath and Coronary Angiography;  Surgeon: Marykay Lex, MD;  Location: [facility] INVASIVE CV LAB;  Service: Cardiovascular;  Laterality: N/A;    Allergies  No Known Allergies  HPI  44 y.o. male with past medical history of hypertension, obesity and hypercholesterolemia, who presented to urgent care with shortness of breath, orthopnea proximal nocturnal dyspnea or lower extremity edema and was diagnosed with hypertensive urgency. The patient was admitted and started on multiple medication including BiDil, carvedilol, lisinopril and furosemide he was treated for congestive heart failure was found to have decreased LV ejection fraction of 40% that was believed secondary to poorly controlled hypertension. Patient also had mildly elevated troponin at 0.06 believed to be secondary to hypertensive urgency and heart failure. He is coming today and states that he feels significantly better with resolved lower extremity edema, improved orthopnea and no paroxysmal nocturnal dyspnea his dyspnea on exertion has improved. 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 He denies claudications, palpitations or  syncope. The patient is a former smoker quit in 2011 and has no family history of premature coronary artery disease.  10/11/2014 - the patient is coming after 4 months, he is complaining of worsening DOE, orthopnea, PND and worsening LE edema, weight gain. He works at the [employer] and is unable to work whole 8 hour shifts. He increased his lasix to 120 mg in the am and 80 mg in the pm.  01/11/15 - 3 months follow up, up 9 lbs, continues eating salt and fried food, compliant with his meds, tested for OSA and is scheduled to fit the CPAP machine. Complains of orthopnea, PND, DOE and LE edema, no chest pain. Cath in July 2016 showed normal coronaries, normal LVEDP, mild pulmonary hypertension.  Home Medications  Prior to Admission medications   Medication Sig Start Date End Date Taking? Authorizing Provider  carvedilol (COREG) 25 MG tablet Take 1 tablet (25 mg total) by mouth 2 (two) times daily with a meal. 06/06/14  Yes Vassie Loll, MD  furosemide (LASIX) 40 MG tablet Take 1 tablet (40 mg total) by mouth 2 (two) times daily. 06/06/14  Yes Vassie Loll, MD  isosorbide-hydrALAZINE (BIDIL) 20-37.5 MG per tablet Take 1 tablet by mouth 3 (three) times daily. 06/06/14  Yes Vassie Loll, MD  lisinopril (PRINIVIL,ZESTRIL) 20 MG tablet Take 1 tablet (20 mg total) by mouth daily. 06/06/14  Yes Vassie Loll, MD  celecoxib (CELEBREX) 200 MG capsule Take 200 mg by mouth daily.    Historical Provider, MD  traMADol (ULTRAM) 50 MG tablet Take 50 mg  by mouth every 6 (six) hours as needed for moderate pain.    Historical Provider, MD    Family History  Family History  Problem Relation Age of Onset  . Diabetes Father   . Hypertension Mother   . Congestive Heart Failure Father     Social History  Social History   Social History  . Marital Status: Single    Spouse Name: N/A  . Number of Children: N/A  . Years of Education: N/A   Occupational History  . Not on file.   Social History Main  Topics  . Smoking status: Former Smoker -- 0.12 packs/day for 20 years    Types: Cigarettes  . Smokeless tobacco: Never Used     Comment: "stopped smoking in ~ 2010-2011"  . Alcohol Use: Yes     Comment: 06/05/2014 "might have a few drinks/month"  . Drug Use: No  . Sexual Activity: Not Currently   Other Topics Concern  . Not on file   Social History Narrative     Review of Systems, as per HPI, otherwise negative General:  No chills, fever, night sweats or weight changes.  Cardiovascular:  No chest pain, dyspnea on exertion, edema, orthopnea, palpitations, paroxysmal nocturnal dyspnea. Dermatological: No rash, lesions/masses Respiratory: No cough, dyspnea Urologic: No hematuria, dysuria Abdominal:   No nausea, vomiting, diarrhea, bright red blood per rectum, melena, or hematemesis Neurologic:  No visual changes, wkns, changes in mental status. All other systems reviewed and are otherwise negative except as noted above.  Physical Exam  Blood pressure 142/92, pulse 84, height  (1.803 m), weight 349 lb (158.305 kg).  General: Pleasant, NAD, morbidly obese Psych: Normal affect. Neuro: Alert and oriented X 3. Moves all extremities spontaneously. HEENT: Normal  Neck: Supple without bruits or JVD. Lungs:  Resp regular and unlabored, minimal basal rales. Heart: RRR no s3, s4, or murmurs. Abdomen: Soft, non-tender, non-distended, BS + x 4.  Extremities: No clubbing, cyanosis, +1 B/L edema. DP/PT/Radials 2+ and equal bilaterally.  Labs:  No results for input(s): CKTOTAL, CKMB, TROPONINI in the last 72 hours. Lab Results  Component Value Date   WBC 9.6 10/11/2014   HGB 13.9 10/11/2014   HCT 42.5 10/11/2014   MCV 81.1 10/11/2014   PLT 232.0 10/11/2014    No results found for: DDIMER Invalid input(s): POCBNP    Component Value Date/Time   NA 141 10/30/2014 1704   K 4.0 10/30/2014 1704   CL 101 10/30/2014 1704   CO2 24 10/30/2014 1704   GLUCOSE 87 10/30/2014 1704    BUN 19 10/30/2014 1704   CREATININE 1.21 10/30/2014 1704   CREATININE 1.18 10/11/2014 1605   CALCIUM 10.1 10/30/2014 1704   PROT 7.5 10/11/2014 1605   ALBUMIN 4.0 10/11/2014 1605   AST 20 10/11/2014 1605   ALT 23 10/11/2014 1605   ALKPHOS 68 10/11/2014 1605   BILITOT 0.4 10/11/2014 1605   GFRNONAA >90 06/06/2014 0440   GFRAA >90 06/06/2014 0440   No results found for: CHOL, HDL, LDLCALC, TRIG  Accessory Clinical Findings  Echocardiogram - 06/05/2014 Left ventricle: The cavity size was normal. Wall thickness was normal. Systolic function was mildly to moderately reduced. The estimated ejection fraction was in the range of 40% to 45%. Diffuse hypokinesis. Doppler parameters are consistent with a reversible restrictive pattern, indicative of decreased left ventricular diastolic compliance and/or increased left atrial pressure (grade 3 diastolic dysfunction). - Mitral valve: There was mild regurgitation. - Left atrium: The atrium was moderately dilated.  ECG - SR, LVH with repolarization abnormalities  Right and left cardiac cath: 10/18/2014 1. The left ventricular systolic function is normal by LV Gram with normal LVEDP that is lower than PCWP. 2. Angiographically normal coroanry arteries. 3. Mild Pulmonary HTN with mildly elevated PCWP, but normal LVEDP.    Assessment & Plan  44 year old male   1-acute on chronic combined systolic/diastolic heart failure: originally the patient was admitted with hypertensive urgency, his CHF was believed to be sec to long standing hypertension and obesity. He had mildly elevated troponin on admission that was believed to be sec to CHF. He was started on lisinopril, carvedilol and BiDil. He improved at first but now his symptoms are worsening despite good BP control and increased diuretic dosages. He was scheduled for a cardiac MRI however didn't fit the scanner. I am concern that because of his size his nuclear study will be heavily  affected by artifacts. Also, he has grade 3 diastolic dysfunction with normal wall thickness at age 31 years. Hemochromatosis should be excluded (unfortunately didn't fit MRI scanner).  Currently on: Change lasix to 80 mg po BID. Add metolazone 2.5 mg po daily. Add spironolactone 25 mg po daily.   Continues to gain weight, the main problem is lifestyle and obesity. We will check CMP and BNP today, if Crea ok, increase Lasix to 160 mg po BID.  2. Cardiomyopathy - of unknown etiology, this is possibly secondary to poorly controlled long-standing hypertension, however ischemia needs to be rule out to specifically with symptoms of exertional chest pain.   3. Accelerated HTN:  - Controlled on carvedilol, lisinopril, bidil and lasix - advise to follow low sodium diet  4-obesity: low calorie diet and exercise discussed with patient  5. OSA - waiting for machine fitting.  Follow up in 3 months  Lars Masson, MD, [facility] 01/11/2015, 8:20 AM

## 2015-01-11 NOTE — Patient Instructions (Signed)
Medication Instructions:   Your physician recommends that you continue on your current medications as directed. Please refer to the Current Medication list given to you today.    Labwork:  TODAY--CMET AND BNP     Follow-Up:  3 MONTHS WITH DR Delton See

## 2015-01-14 ENCOUNTER — Telehealth: Payer: Self-pay | Admitting: *Deleted

## 2015-01-14 MED ORDER — FUROSEMIDE 80 MG PO TABS: ORAL_TABLET | ORAL | Status: DC

## 2015-01-14 NOTE — Telephone Encounter (Signed)
Notified the pt of lab results and recommendations per Dr Delton See for him to increase his lasix to 160 mg po in the am, and 80 mg po in the afternoon, and follow-up with her in the clinic in 3 months.  Informed the pt that he would need to take 2 tablets of his 80 mg lasix to equal 160 mg po at 8 am and take 1 tablet of his 80 mg lasix po at 2 pm everyday.  Confirmed the pharmacy of choice with the pt.  Pt scheduled for a follow-up appt with Dr Delton See on 04/16/15.  Pt verbalized understanding and agrees with this plan.

## 2015-01-14 NOTE — Telephone Encounter (Signed)
-----   Message from Lars Masson, MD sent at 01/11/2015  4:04 PM EDT ----- I would increase lasix to 160 mg po in the am and 80 mg in the afternoon, follow up in 3 months.

## 2015-01-17 ENCOUNTER — Ambulatory Visit: Payer: No Typology Code available for payment source | Attending: Internal Medicine | Admitting: Internal Medicine

## 2015-01-17 ENCOUNTER — Encounter: Payer: Self-pay | Admitting: Internal Medicine

## 2015-01-17 VITALS — BP 162/128 | HR 87 | Temp 98.1°F | Resp 18 | Wt 342.6 lb

## 2015-01-17 DIAGNOSIS — I5041 Acute combined systolic (congestive) and diastolic (congestive) heart failure: Secondary | ICD-10-CM

## 2015-01-17 DIAGNOSIS — IMO0001 Reserved for inherently not codable concepts without codable children: Secondary | ICD-10-CM

## 2015-01-17 DIAGNOSIS — R030 Elevated blood-pressure reading, without diagnosis of hypertension: Secondary | ICD-10-CM

## 2015-01-17 DIAGNOSIS — G4730 Sleep apnea, unspecified: Secondary | ICD-10-CM

## 2015-01-17 DIAGNOSIS — Z6841 Body Mass Index (BMI) 40.0 and over, adult: Secondary | ICD-10-CM

## 2015-01-17 DIAGNOSIS — I43 Cardiomyopathy in diseases classified elsewhere: Secondary | ICD-10-CM

## 2015-01-17 DIAGNOSIS — Z23 Encounter for immunization: Secondary | ICD-10-CM

## 2015-01-17 DIAGNOSIS — I110 Hypertensive heart disease with heart failure: Secondary | ICD-10-CM

## 2015-01-17 DIAGNOSIS — M170 Bilateral primary osteoarthritis of knee: Secondary | ICD-10-CM

## 2015-01-17 MED ORDER — TRAMADOL HCL 50 MG PO TABS: ORAL_TABLET | ORAL | Status: DC

## 2015-01-17 MED ORDER — CLONIDINE HCL 0.1 MG PO TABS
0.1000 mg | ORAL_TABLET | Freq: Once | ORAL | Status: AC
  Administered 2015-01-17: 0.1 mg via ORAL

## 2015-01-17 NOTE — Patient Instructions (Signed)
Once you get hospital discount/orange card I can place order for Pulmonology (sleep apnea doctor) and Orthopedics for knee  Scott Livingston will call you with application information  Once you get in with lung doctor they will give you settings for a CPAP machine. Will will try to help you get a machine here.   You can try [name] store and see if you can find a penis pump or penile vacuum.

## 2015-01-17 NOTE — Progress Notes (Signed)
Patient ID: Scott Livingston, male   DOB: [birth year], 44 y.o.   MRN: [id number]  CC: 3 mo F/u  HPI: Scott Livingston is a 44 y.o. male here today for a follow up visit.  Patient has past medical history of HTN, HLD, MI, OSA, CHF, morbid obesity, and knee osteoarthritis. Patient presents today with severely elevated blood pressure and states that he has not taken his lisinopril yet. He was last seen by his cardiologist 1 week ago and his blood pressure was WNL. He has not lost weight and reports a recent increase in his Lasix. Last BMET was 1 week ago which was also normal. 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 He has only been taking the 160 mg on the weekends. He denies chest pain, SOB. Some edema in BLE.   He c/o of erectile dysfunction for several months that is causing him relationship problems. Patient complains of continued bilateral knee pain. He has been unable to go to orthopedics due to the lack of insurance.   No Known Allergies Past Medical History  Diagnosis Date  . Hypertension   . Hypercholesteremia   . Shortness of breath dyspnea   . Myocardial infarction (HCC) ~ 2010    "mild; I lived in [place]"  . Sleep apnea     "never received a mask" (06/05/2014)  . Headache     "maybe monthly" (06/05/2014)  . Arthritis     "knees" (06/05/2014)  . Acute CHF (congestive heart failure) (HCC)     [name] 06/05/2014   Current Outpatient Prescriptions on File Prior to Visit  Medication Sig Dispense Refill  . aspirin EC 81 MG tablet Take 1 tablet (81 mg total) by mouth daily. 90 tablet 3  . carvedilol (COREG) 25 MG tablet Take 25 mg by mouth daily.    . furosemide (LASIX) 80 MG tablet Take 2 tabs (160 mg) po at 8 am, and take 1 tab (80 mg) po at 2 pm every day. 270 tablet 3  . isosorbide-hydrALAZINE (BIDIL) 20-37.5 MG per tablet Take 1 tablet by mouth 3 (three) times daily. 300 tablet 3  .  lisinopril (PRINIVIL,ZESTRIL) 20 MG tablet Take 1 tablet (20 mg total) by mouth daily. 30 tablet 3  . metolazone (ZAROXOLYN) 2.5 MG tablet Take 1 tablet (2.5 mg total) by mouth daily. 90 tablet 3  . potassium gluconate 595 MG TABS tablet Take 595 mg by mouth 2 (two) times daily.     Marland Kitchen spironolactone (ALDACTONE) 25 MG tablet Take 1 tablet (25 mg total) by mouth daily. 90 tablet 3  . traMADol (ULTRAM) 50 MG tablet May take 1 tablet twice daily as needed for pain 60 tablet 0   No current facility-administered medications on file prior to visit.   Family History  Problem Relation Age of Onset  . Diabetes Father   . Hypertension Mother   . Congestive Heart Failure Father    Social History   Social History  . Marital Status: Single    Spouse Name: N/A  . Number of Children: N/A  . Years of Education: N/A   Occupational History  . Not on file.   Social History Main Topics  . Smoking status: Former Smoker -- 0.12 packs/day for 20 years    Types: Cigarettes  . Smokeless tobacco: Never Used     Comment: "stopped smoking in ~ 2010-2011"  . Alcohol  Use: Yes     Comment: 06/05/2014 "might have a few drinks/month"  . Drug Use: No  . Sexual Activity: Not Currently   Other Topics Concern  . Not on file   Social History Narrative    Review of Systems: Other than what is stated in HPI, all other systems are negative.   Objective:   Filed Vitals:   01/17/15 1710  BP: 162/128  Pulse:   Temp:   Resp:     Physical Exam  Constitutional: He is oriented to person, place, and time.  Severely obese   Cardiovascular: Normal rate, regular rhythm and normal heart sounds.   Pulmonary/Chest: Effort normal and breath sounds normal. He has no wheezes.  Musculoskeletal: Normal range of motion. He exhibits edema (trace) and tenderness (bilateral knees).  Neurological: He is alert and oriented to person, place, and time.  Skin: Skin is warm and dry.  Psychiatric: He has a normal mood and  affect.     Lab Results  Component Value Date   WBC 9.6 10/11/2014   HGB 13.9 10/11/2014   HCT 42.5 10/11/2014   MCV 81.1 10/11/2014   PLT 232.0 10/11/2014   Lab Results  Component Value Date   CREATININE 1.03 01/11/2015   BUN 15 01/11/2015   NA 142 01/11/2015   K 4.1 01/11/2015   CL 106 01/11/2015   CO2 31 01/11/2015    Lab Results  Component Value Date   HGBA1C 6.0* 06/04/2014   Lipid Panel  No results found for: CHOL, TRIG, HDL, CHOLHDL, VLDL, LDLCALC     Assessment and plan:   Scott Livingston was seen today for follow-up and knee pain.  Diagnoses and all orders for this visit:  Acute combined systolic and diastolic congestive heart failure (HCC) I have stressed to patient the need to take Lasix as prescribed to prevent hospitalizations or even worse complication such as fluid overload leading to respiratory failure. Explained that he should wake up early enough to take medication before work so that it can begin to wear off by the time he works.  Hypertensive cardiomyopathy, with heart failure (HCC) Continue medications and follow up with Cardiology. Stressed to patient again the importance of taking medication as prescribed to help control his blood pressure. Stressed weight loss to help with workload of heart.   Elevated blood pressure -     cloNIDine (CATAPRES) tablet 0.1 mg; Take 1 tablet (0.1 mg total) by mouth once in office Patient will go home and immediately take his Lisinopril  Morbid obesity with BMI of 45.0-49.9, adult (HCC) I stressed weight loss and diet extensively. Explained how weight loss will help his BP, heart, sleep apnea, and knee pain. I have explained to patient that that his weight places him at a higher risk of complications and even death considering his several co-morbidities.   Sleep apnea Stressed that he needs to get Pulmonologist and CPAP machine asap. Uncontrolled sleep apnea places patient at greater risk for stroke, heart disease, and  severe complications relating to his heart failure. Patient verbalized understanding and agrees to apply for hospital discount so that he can get referral to pulmonology. I have explained to patient that we have programs in office that may assist him with obtaining a CPAP machine once we have orders on machine settings.   Osteoarthritis of both knees, unspecified osteoarthritis type -     traMADol (ULTRAM) 50 MG tablet; May take 1 tablet twice daily as needed for pain  Need for influenza vaccination -  Flu Vaccine QUAD 36+ mos PF IM (Fluarix & Fluzone Quad PF)  Total time spent with patient was 30 minutes. > 50% spent counseling, coordination care with patient, and explaining the severity of his co-morbidities.   Return in about 6 months (around 07/18/2015).       Ambrose Finland, NP-C [facility] [phone] 01/17/2015, 5:07 PM

## 2015-01-17 NOTE — Progress Notes (Signed)
Patient here for follow-up Patient also complains of bilateral knee pain Patient states he did not take lisinopril today Patient received flu shot today

## 2015-01-21 DIAGNOSIS — G4730 Sleep apnea, unspecified: Secondary | ICD-10-CM | POA: Insufficient documentation

## 2015-01-22 ENCOUNTER — Other Ambulatory Visit: Payer: Self-pay | Admitting: Internal Medicine

## 2015-01-22 DIAGNOSIS — M170 Bilateral primary osteoarthritis of knee: Secondary | ICD-10-CM

## 2015-01-22 DIAGNOSIS — G4730 Sleep apnea, unspecified: Secondary | ICD-10-CM

## 2015-01-28 ENCOUNTER — Telehealth: Payer: Self-pay | Admitting: *Deleted

## 2015-01-28 NOTE — Telephone Encounter (Signed)
Received a message from [facility] that this patient does not have insurance. I have left a message for him to call me to discuss.  If he does not have insurance, we will have to discuss the CPAP Assistance program.

## 2015-02-06 ENCOUNTER — Ambulatory Visit (INDEPENDENT_AMBULATORY_CARE_PROVIDER_SITE_OTHER): Payer: Self-pay | Admitting: Family Medicine

## 2015-02-06 ENCOUNTER — Encounter: Payer: Self-pay | Admitting: Family Medicine

## 2015-02-06 VITALS — BP 150/99 | HR 79 | Ht 71.0 in | Wt 328.0 lb

## 2015-02-06 DIAGNOSIS — M25562 Pain in left knee: Secondary | ICD-10-CM

## 2015-02-06 DIAGNOSIS — M25561 Pain in right knee: Secondary | ICD-10-CM

## 2015-02-06 MED ORDER — MELOXICAM 15 MG PO TABS: 15.0000 mg | ORAL_TABLET | Freq: Every day | ORAL | Status: DC

## 2015-02-06 NOTE — Assessment & Plan Note (Signed)
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 With his weight, this may be bilateral medial compartment OA. -We'll obtain standing 4 view knee x-rays bilateral. -Start Mobic daily along with Tylenol when necessary -Consider topical medication along with knee sleeve versus patellar stabilizing sleeve -If truly arthritis can consider intra-articular injection next visit. -If chondromalacia would continue with NSAIDs and knee exercises along with ice and activity modification

## 2015-02-06 NOTE — Progress Notes (Signed)
  Scott Livingston - 44 y.o. male MRN [id number]  Date of birth: 02/17/1971  SUBJECTIVE:  Including CC & ROS.  Scott Livingston is a 44 y.o. male who presents today for bilateral knee pain.    Knee Pain bilateral, initial visit - patient presents today for ongoing bilateral knee pain. It is located in the anterior medial aspect of both knees. His been ongoing now for 3-4 years and has [facility]. He did have x-rays done at that time which supposedly showed arthritis and had 2 different times of intra-articular steroid injections, one of which relieved his symptoms. He has tried tramadol, ibuprofen, Tylenol arthritis with minimum relief. No specific injury to either knee at this time. Pain is worse with going up or down stairs or when sitting for a prolonged period. Denies locking, catching, giving way but does note instability at times. No frank effusions or erythema and denies fevers.  PMHx - Updated and reviewed.  Contributory factors include: Obesity, systolic congestive heart failure, hypertension PSHx - Updated and reviewed.  Contributory factors include:  Noncontributory FHx - Updated and reviewed.  Contributory factors include:  Noncontributory Medications - tramadol when necessary   12 point ROS negative other than per HPI.   Exam:  Filed Vitals:   02/06/15 1329  BP: 150/99  Pulse: 79    Gen: NAD Cardiorespiratory - Normal respiratory effort/rate.  RRR Skin: No rashes or erythema Extremities: No edema, pulses + 2 B/L UE/LE  Knee:  Normal to inspection with no erythema or effusion or obvious bony abnormalities.  No obvious Baker's cysts Palpation normal with no warmth or joint line tenderness or patellar tenderness or condyle tenderness.  No TTP along infrapatellar or pes anserine bursas.   ROM normal in flexion (135 degrees) and extension (0 degrees) and lower leg rotation. Ligaments with solid consistent endpoints including ACL, PCL, LCL, MCL.  Negative  Anterior Drawer/Lachman/Pivot Shift Negative Mcmurray's and provocative meniscal tests including Thessaly and Apley compression testing  + crepitus with knee extension.  Normal Patellar glide.  No apprehension Patellar and quadriceps tendons unremarkable. Hamstring and quadriceps strength is normal.  Neurovascularly intact B/L LE

## 2015-02-20 ENCOUNTER — Ambulatory Visit: Payer: No Typology Code available for payment source | Admitting: Family Medicine

## 2015-04-16 ENCOUNTER — Ambulatory Visit: Payer: No Typology Code available for payment source | Admitting: Cardiology

## 2015-04-24 ENCOUNTER — Encounter: Payer: Self-pay | Admitting: Cardiology

## 2015-09-10 ENCOUNTER — Emergency Department (HOSPITAL_COMMUNITY)
Admission: EM | Admit: 2015-09-10 | Discharge: 2015-09-11 | Discharge status: Against Medical Advice | Payer: Self-pay | Attending: Emergency Medicine | Admitting: Emergency Medicine

## 2015-09-10 ENCOUNTER — Encounter (HOSPITAL_COMMUNITY): Payer: Self-pay | Admitting: Emergency Medicine

## 2015-09-10 DIAGNOSIS — I509 Heart failure, unspecified: Secondary | ICD-10-CM | POA: Insufficient documentation

## 2015-09-10 DIAGNOSIS — R0600 Dyspnea, unspecified: Secondary | ICD-10-CM | POA: Insufficient documentation

## 2015-09-10 DIAGNOSIS — F17210 Nicotine dependence, cigarettes, uncomplicated: Secondary | ICD-10-CM | POA: Insufficient documentation

## 2015-09-10 DIAGNOSIS — Z9889 Other specified postprocedural states: Secondary | ICD-10-CM | POA: Insufficient documentation

## 2015-09-10 DIAGNOSIS — R778 Other specified abnormalities of plasma proteins: Secondary | ICD-10-CM

## 2015-09-10 DIAGNOSIS — Z79899 Other long term (current) drug therapy: Secondary | ICD-10-CM | POA: Insufficient documentation

## 2015-09-10 DIAGNOSIS — R7989 Other specified abnormal findings of blood chemistry: Secondary | ICD-10-CM | POA: Insufficient documentation

## 2015-09-10 DIAGNOSIS — I252 Old myocardial infarction: Secondary | ICD-10-CM | POA: Insufficient documentation

## 2015-09-10 DIAGNOSIS — I10 Essential (primary) hypertension: Secondary | ICD-10-CM | POA: Insufficient documentation

## 2015-09-10 DIAGNOSIS — M1990 Unspecified osteoarthritis, unspecified site: Secondary | ICD-10-CM | POA: Insufficient documentation

## 2015-09-10 NOTE — ED Notes (Signed)
Pt reports that his BP was high starting this morning. Pt reports taking meds as prescribed. Pt denies numbness, tingling, HA. Pt reports that he was SOB this morning but it is better now.

## 2015-09-10 NOTE — ED Provider Notes (Signed)
CSN: [id number]     Arrival date & time 09/10/15  2122 History  By signing my name below, I, Scott Livingston, attest that this documentation has been prepared under the direction and in the presence of [name], MD. Electronically Signed: Bethel Livingston, ED Scribe. 09/11/2015. 12:14 AM   Chief Complaint  Patient presents with  . Hypertension  . Shortness of Breath   The history is provided by the patient. No language interpreter was used.   Scott Livingston is a 45 y.o. male with PMHx of MI, CHF, HTN, and hypercholesteremia who presents to the Emergency Department complaining of intermittent SOB with onset yesterday morning at work. He states "it's like I'm having a panic attack or something". Rest has provided no relief in symptoms at home. Pt states that he can tell that his blood pressure is high. He last measured his blood pressure 2 days ago and it was 214/140. Pt denies chest pain or pressure, nausea, vomiting, LE swelling, numbness, tingling, and headache.   Past Medical History  Diagnosis Date  . Hypertension   . Hypercholesteremia   . Shortness of breath dyspnea   . Myocardial infarction (HCC) ~ 2010    "mild; I lived in [place]"  . Sleep apnea     "never received a mask" (06/05/2014)  . Headache     "maybe monthly" (06/05/2014)  . Arthritis     "knees" (06/05/2014)  . Acute CHF (congestive heart failure) (HCC)     Scott Livingston 06/05/2014   Past Surgical History  Procedure Laterality Date  . No past surgeries    . Cardiac catheterization N/A 10/18/2014    Procedure: Right/Left Heart Cath and Coronary Angiography;  Surgeon: Marykay Lex, MD;  Location: [facility] INVASIVE CV LAB;  Service: Cardiovascular;  Laterality: N/A;   Family History  Problem Relation Age of Onset  . Diabetes Father   . Hypertension Mother   . Congestive Heart Failure Father    Social History  Substance Use Topics  . Smoking status: Current Every Day Smoker -- 0.12 packs/day for 20 years    Types: Cigarettes  .  Smokeless tobacco: Never Used     Comment: "stopped smoking in ~ 2010-2011"  . Alcohol Use: Yes     Comment: 06/05/2014 "might have a few drinks/month"    Review of Systems  Respiratory: Positive for shortness of breath.   Cardiovascular: Negative for chest pain and leg swelling.  Gastrointestinal: Negative for nausea and vomiting.  Neurological: Negative for weakness, numbness and headaches.  All other systems reviewed and are negative.     Allergies  Review of patient's allergies indicates no known allergies.  Home Medications   Prior to Admission medications   Medication Sig Start Date End Date Taking? Authorizing Provider  Aspirin-Salicylamide-Caffeine (BC HEADACHE POWDER PO) Take 1 packet by mouth daily as needed (pain).   Yes Historical Provider, MD  carvedilol (COREG) 25 MG tablet Take 25 mg by mouth 2 (two) times daily.    Yes Historical Provider, MD  furosemide (LASIX) 80 MG tablet Take 2 tabs (160 mg) po at 8 am, and take 1 tab (80 mg) po at 2 pm every day. Patient taking differently: Take 80 mg by mouth daily.  01/14/15  Yes Lars Masson, MD  ibuprofen (ADVIL,MOTRIN) 200 MG tablet Take 600 mg by mouth every 6 (six) hours as needed for moderate pain.   Yes Historical Provider, MD  lisinopril (PRINIVIL,ZESTRIL) 20 MG tablet Take 1 tablet (20 mg total) by mouth  daily. 08/27/14  Yes Ambrose Finland, NP  spironolactone (ALDACTONE) 25 MG tablet Take 1 tablet (25 mg total) by mouth daily. 10/11/14  Yes Lars Masson, MD   BP 172/122 mmHg  Pulse 76  Temp(Src) 99.2 F (37.3 C) (Oral)  Resp 20  Wt 310 lb (140.615 kg)  SpO2 99% Physical Exam  Constitutional: He is oriented to person, place, and time. He appears well-developed and well-nourished.  HENT:  Head: Normocephalic and atraumatic.  Eyes: EOM are normal.  Neck: Normal range of motion.  Cardiovascular: Normal rate, regular rhythm, normal heart sounds and intact distal pulses.   Pulmonary/Chest: Effort normal and  breath sounds normal. No respiratory distress.  Abdominal: Soft. He exhibits no distension. There is no tenderness.  Musculoskeletal: Normal range of motion.  Trace pedal edema bilaterally   Neurological: He is alert and oriented to person, place, and time.  Skin: Skin is warm and dry.  Psychiatric: He has a normal mood and affect. Judgment normal.  Nursing note and vitals reviewed.   ED Course  Procedures (including critical care time) DIAGNOSTIC STUDIES: Oxygen Saturation is 99% on RA,  normal by my interpretation.    COORDINATION OF CARE: 12:08 AM Discussed treatment plan which includes lab work, CXR, EKG with pt at bedside and pt agreed to plan.  Labs Review Results for orders placed or performed during the hospital encounter of 09/10/15  Basic metabolic panel  Result Value Ref Range   Sodium 138 135 - 145 mmol/L   Potassium 3.9 3.5 - 5.1 mmol/L   Chloride 105 101 - 111 mmol/L   CO2 25 22 - 32 mmol/L   Glucose, Bld 93 65 - 99 mg/dL   BUN 16 6 - 20 mg/dL   Creatinine, Ser 4.27 0.61 - 1.24 mg/dL   Calcium 8.9 8.9 - 06.2 mg/dL   GFR calc non Af Amer >60 >60 mL/min   GFR calc Af Amer >60 >60 mL/min   Anion gap 8 5 - 15  CBC with Differential  Result Value Ref Range   WBC 7.5 4.0 - 10.5 K/uL   RBC 5.31 4.22 - 5.81 MIL/uL   Hemoglobin 14.0 13.0 - 17.0 g/dL   HCT 37.6 28.3 - 15.1 %   MCV 83.1 78.0 - 100.0 fL   MCH 26.4 26.0 - 34.0 pg   MCHC 31.7 30.0 - 36.0 g/dL   RDW 76.1 60.7 - 37.1 %   Platelets 195 150 - 400 K/uL   Neutrophils Relative % 67 %   Neutro Abs 5.0 1.7 - 7.7 K/uL   Lymphocytes Relative 24 %   Lymphs Abs 1.8 0.7 - 4.0 K/uL   Monocytes Relative 6 %   Monocytes Absolute 0.5 0.1 - 1.0 K/uL   Eosinophils Relative 3 %   Eosinophils Absolute 0.2 0.0 - 0.7 K/uL   Basophils Relative 0 %   Basophils Absolute 0.0 0.0 - 0.1 K/uL  Troponin I  Result Value Ref Range   Troponin I 0.04 (H) <0.031 ng/mL  Brain natriuretic peptide  Result Value Ref Range   B  Natriuretic Peptide 201.5 (H) 0.0 - 100.0 pg/mL  Troponin I  Result Value Ref Range   Troponin I 0.05 (H) <0.031 ng/mL   Imaging Review Dg Chest 2 View  09/11/2015  CLINICAL DATA:  Shortness of breath and chest pressure tonight. EXAM: CHEST  2 VIEW COMPARISON:  06/02/2014 FINDINGS: Chronic cardiomegaly. Vascular congestion appears chronic. Minimal peribronchial cuffing may reflect mild edema. No pleural effusion or  pneumothorax. No focal airspace opacity. IMPRESSION: Chronic cardiomegaly and vascular congestion. Minimal peribronchial cuffing may reflect mild pulmonary edema. Electronically Signed   By: Rubye OaksMelanie  Ehinger M.D.   On: 09/11/2015 00:29   I have personally reviewed and evaluated these images and lab results as part of my medical decision-making.   EKG Interpretation   Date/Time:  Tuesday Sep 10 2015 21:59:48 EDT Ventricular Rate:  72 PR Interval:  166 QRS Duration: 102 QT Interval:  442 QTC Calculation: 483 R Axis:   36 Text Interpretation:  Normal sinus rhythm Possible Left atrial enlargement  T wave abnormality, consider lateral ischemia Prolonged QT Abnormal ECG  When compared with ECG of 10/18/2014, T wave inversion less evident  in  latereal and inferior leads Confirmed by [facility]  MD, Court Gracia ([id number]) on  09/11/2015 7:51:18 AM       MDM   Final diagnoses:  Dyspnea  Uncontrolled hypertension  Elevated troponin I level    Dyspnea and hypertension. Blood pressure is elevated but not at a worrisome level. He is observed in the ED and screening labs are obtained including troponin. Old records are reviewed and he does have prior admissions for congestive heart failure. Catheterization in July 2016 showed pulmonary hypertension with normal coronary arteries. Initial troponin came back borderline elevated. He has had elevated troponins in the past. He was kept in the ED for repeat troponin which actually came back higher at 0.05. At this point, I felt that he needed to be  admitted for serial troponins. However, patient refused to be admitted. He states that he has to be at his job and is been unable to get insurance because he kept getting fired from jobs because of medical issues. I did discuss with him the risk of myocardial infarction and death and he expressed understanding. His blood pressure had been significantly elevated at prior ED visits. He does not have a PCP. Decision was made to add amlodipine to his regimen and is given a prescription for same. He is advised to return should he change his mind about being admitted or if any symptoms are getting worse.  I personally performed the services described in this documentation, which was scribed in my presence. The recorded information has been reviewed and is accurate.      Dione Boozeavid Esias Mory, MD 09/11/15 [phone]

## 2015-09-11 ENCOUNTER — Emergency Department (HOSPITAL_COMMUNITY): Payer: Self-pay

## 2015-09-11 LAB — CBC WITH DIFFERENTIAL/PLATELET
Basophils Absolute: 0 10*3/uL (ref 0.0–0.1)
Basophils Relative: 0 %
EOS ABS: 0.2 10*3/uL (ref 0.0–0.7)
Eosinophils Relative: 3 %
HEMATOCRIT: 44.1 % (ref 39.0–52.0)
HEMOGLOBIN: 14 g/dL (ref 13.0–17.0)
LYMPHS ABS: 1.8 10*3/uL (ref 0.7–4.0)
LYMPHS PCT: 24 %
MCH: 26.4 pg (ref 26.0–34.0)
MCHC: 31.7 g/dL (ref 30.0–36.0)
MCV: 83.1 fL (ref 78.0–100.0)
MONOS PCT: 6 %
Monocytes Absolute: 0.5 10*3/uL (ref 0.1–1.0)
NEUTROS ABS: 5 10*3/uL (ref 1.7–7.7)
NEUTROS PCT: 67 %
Platelets: 195 10*3/uL (ref 150–400)
RBC: 5.31 MIL/uL (ref 4.22–5.81)
RDW: 15.3 % (ref 11.5–15.5)
WBC: 7.5 10*3/uL (ref 4.0–10.5)

## 2015-09-11 LAB — BASIC METABOLIC PANEL
Anion gap: 8 (ref 5–15)
BUN: 16 mg/dL (ref 6–20)
CHLORIDE: 105 mmol/L (ref 101–111)
CO2: 25 mmol/L (ref 22–32)
CREATININE: 1 mg/dL (ref 0.61–1.24)
Calcium: 8.9 mg/dL (ref 8.9–10.3)
GFR calc non Af Amer: >60 mL/min (ref >60)
Glucose, Bld: 93 mg/dL (ref 65–99)
POTASSIUM: 3.9 mmol/L (ref 3.5–5.1)
Sodium: 138 mmol/L (ref 135–145)

## 2015-09-11 LAB — TROPONIN I
Troponin I: 0.04 ng/mL — ABNORMAL HIGH (ref <0.031)
Troponin I: 0.05 ng/mL — ABNORMAL HIGH (ref <0.031)

## 2015-09-11 LAB — BRAIN NATRIURETIC PEPTIDE: B Natriuretic Peptide: 201.5 pg/mL — ABNORMAL HIGH (ref 0.0–100.0)

## 2015-09-11 MED ORDER — FUROSEMIDE 10 MG/ML IJ SOLN
80.0000 mg | Freq: Once | INTRAMUSCULAR | Status: AC
  Administered 2015-09-11: 80 mg via INTRAVENOUS
  Filled 2015-09-11: qty 8

## 2015-09-11 MED ORDER — ASPIRIN 81 MG PO CHEW
324.0000 mg | CHEWABLE_TABLET | Freq: Once | ORAL | Status: AC
  Administered 2015-09-11: 324 mg via ORAL
  Filled 2015-09-11: qty 4

## 2015-09-11 MED ORDER — AMLODIPINE BESYLATE 10 MG PO TABS: 10.0000 mg | ORAL_TABLET | Freq: Every day | ORAL | Status: DC

## 2015-09-11 NOTE — Discharge Instructions (Signed)
You have chosen not to be admitted to the hospital in spite of my recommendation. If your symptoms are getting worse, please return to the emergency department immediately. Your blood pressure has been running very high. I'm adding another medication to your blood pressure regimen. Mrs. amlodipine (Norvasc). Please take it once a day. Please check your blood pressure at home on a daily basis, and make an appointment to see your doctor as soon as possible.  Acknowledgement of Risk of Discharge  Against Medical Advice   And Release of Liability    Because I am choosing to leave the hospital in spite of these risks, I release the hospital, its employees and officers, and my attending physician from all liability for any adverse results caused by my leaving the hospital prematurely.   ________________________________      _____________________________________ Patient's Signature                                        Date and Time   ________________________________      _____________________________________ Witness' Signature                                        Relationship to Patient    ________________________________      _____________________________________ Witness' Signature                                        Relationship to Patient   [If patient refuses to sign, write "Patient refused to sign" on the patient's signature line and have witnesses to the refusal sign as witnesses.]     Hypertension Hypertension, commonly called high blood pressure, is when the force of blood pumping through your arteries is too strong. Your arteries are the blood vessels that carry blood from your heart throughout your body. A blood pressure reading consists of a higher number over a lower number, such as 110/72. The higher number (systolic) is the pressure inside your arteries when your heart pumps. The lower number (diastolic) is the pressure inside your arteries when your heart relaxes.  Ideally you want your blood pressure below 120/80. Hypertension forces your heart to work harder to pump blood. Your arteries may become narrow or stiff. Having untreated or uncontrolled hypertension can cause heart attack, stroke, kidney disease, and other problems. RISK FACTORS Some risk factors for high blood pressure are controllable. Others are not.  Risk factors you cannot control include:   Race. You may be at higher risk if you are African American.  Age. Risk increases with age.  Gender. Men are at higher risk than women before age 44 years. After age 6, women are at higher risk than men. Risk factors you can control include:  Not getting enough exercise or physical activity.  Being overweight.  Getting too much fat, sugar, calories, or salt in your diet.  Drinking too much alcohol. SIGNS AND SYMPTOMS Hypertension does not usually cause signs or symptoms. Extremely high blood pressure (hypertensive crisis) may cause headache, anxiety, shortness of breath, and nosebleed. DIAGNOSIS To check if you have hypertension, your health care provider will measure your blood pressure while you are seated, with your arm held at the level of your heart. It  should be measured at least twice using the same arm. Certain conditions can cause a difference in blood pressure between your right and left arms. A blood pressure reading that is higher than normal on one occasion does not mean that you need treatment. If it is not clear whether you have high blood pressure, you may be asked to return on a different day to have your blood pressure checked again. Or, you may be asked to monitor your blood pressure at home for 1 or more weeks. TREATMENT Treating high blood pressure includes making lifestyle changes and possibly taking medicine. Living a healthy lifestyle can help lower high blood pressure. You may need to change some of your habits. Lifestyle changes may include:  Following the DASH diet.  This diet is high in fruits, vegetables, and whole grains. It is low in salt, red meat, and added sugars.  Keep your sodium intake below 2,300 mg per day.  Getting at least 30-45 minutes of aerobic exercise at least 4 times per week.  Losing weight if necessary.  Not smoking.  Limiting alcoholic beverages.  Learning ways to reduce stress. Your health care provider may prescribe medicine if lifestyle changes are not enough to get your blood pressure under control, and if one of the following is true:  You are 53-65 years of age and your systolic blood pressure is above 140.  You are 37 years of age or older, and your systolic blood pressure is above 150.  Your diastolic blood pressure is above 90.  You have diabetes, and your systolic blood pressure is over 140 or your diastolic blood pressure is over 90.  You have kidney disease and your blood pressure is above 140/90.  You have heart disease and your blood pressure is above 140/90. Your personal target blood pressure may vary depending on your medical conditions, your age, and other factors. HOME CARE INSTRUCTIONS  Have your blood pressure rechecked as directed by your health care provider.   Take medicines only as directed by your health care provider. Follow the directions carefully. Blood pressure medicines must be taken as prescribed. The medicine does not work as well when you skip doses. Skipping doses also puts you at risk for problems.  Do not smoke.   Monitor your blood pressure at home as directed by your health care provider. SEEK MEDICAL CARE IF:   You think you are having a reaction to medicines taken.  You have recurrent headaches or feel dizzy.  You have swelling in your ankles.  You have trouble with your vision. SEEK IMMEDIATE MEDICAL CARE IF:  You develop a severe headache or confusion.  You have unusual weakness, numbness, or feel faint.  You have severe chest or abdominal pain.  You vomit  repeatedly.  You have trouble breathing. MAKE SURE YOU:   Understand these instructions.  Will watch your condition.  Will get help right away if you are not doing well or get worse.   This information is not intended to replace advice given to you by your health care provider. Make sure you discuss any questions you have with your health care provider.   Document Released: 03/30/2005 Document Revised: 08/14/2014 Document Reviewed: 01/20/2013 Elsevier Interactive Patient Education 2016 Elsevier Inc.  Shortness of Breath Shortness of breath means you have trouble breathing. It could also mean that you have a medical problem. You should get immediate medical care for shortness of breath. CAUSES   Not enough oxygen in the air such as  with high altitudes or a smoke-filled room.  Certain lung diseases, infections, or problems.  Heart disease or conditions, such as angina or heart failure.  Low red blood cells (anemia).  Poor physical fitness, which can cause shortness of breath when you exercise.  Chest or back injuries or stiffness.  Being overweight.  Smoking.  Anxiety, which can make you feel like you are not getting enough air. DIAGNOSIS  Serious medical problems can often be found during your physical exam. Tests may also be done to determine why you are having shortness of breath. Tests may include:  Chest X-rays.  Lung function tests.  Blood tests.  An electrocardiogram (ECG).  An ambulatory electrocardiogram. An ambulatory ECG records your heartbeat patterns over a 24-hour period.  Exercise testing.  A transthoracic echocardiogram (TTE). During echocardiography, sound waves are used to evaluate how blood flows through your heart.  A transesophageal echocardiogram (TEE).  Imaging scans. Your health care provider may not be able to find a cause for your shortness of breath after your exam. In this case, it is important to have a follow-up exam with your  health care provider as directed.  TREATMENT  Treatment for shortness of breath depends on the cause of your symptoms and can vary greatly. HOME CARE INSTRUCTIONS   Do not smoke. Smoking is a common cause of shortness of breath. If you smoke, ask for help to quit.  Avoid being around chemicals or things that may bother your breathing, such as paint fumes and dust.  Rest as needed. Slowly resume your usual activities.  If medicines were prescribed, take them as directed for the full length of time directed. This includes oxygen and any inhaled medicines.  Keep all follow-up appointments as directed by your health care provider. SEEK MEDICAL CARE IF:   Your condition does not improve in the time expected.  You have a hard time doing your normal activities even with rest.  You have any new symptoms. SEEK IMMEDIATE MEDICAL CARE IF:   Your shortness of breath gets worse.  You feel light-headed, faint, or develop a cough not controlled with medicines.  You start coughing up blood.  You have pain with breathing.  You have chest pain or pain in your arms, shoulders, or abdomen.  You have a fever.  You are unable to walk up stairs or exercise the way you normally do. MAKE SURE YOU:  Understand these instructions.  Will watch your condition.  Will get help right away if you are not doing well or get worse.   This information is not intended to replace advice given to you by your health care provider. Make sure you discuss any questions you have with your health care provider.   Document Released: 12/23/2000 Document Revised: 04/04/2013 Document Reviewed: 06/15/2011 Elsevier Interactive Patient Education 2016 [employer].  Amlodipine tablets What is this medicine? AMLODIPINE (am LOE di peen) is a calcium-channel blocker. It affects the amount of calcium found in your heart and muscle cells. This relaxes your blood vessels, which can reduce the amount of work the heart has to  do. This medicine is used to lower high blood pressure. It is also used to prevent chest pain. This medicine may be used for other purposes; ask your health care provider or pharmacist if you have questions. What should I tell my health care provider before I take this medicine? They need to know if you have any of these conditions: -heart problems like heart failure or aortic stenosis -  liver disease -an unusual or allergic reaction to amlodipine, other medicines, foods, dyes, or preservatives -pregnant or trying to get pregnant -breast-feeding How should I use this medicine? Take this medicine by mouth with a glass of water. Follow the directions on the prescription label. Take your medicine at regular intervals. Do not take more medicine than directed. Talk to your pediatrician regarding the use of this medicine in children. Special care may be needed. This medicine has been used in children as young as 6. Persons over 43 years old may have a stronger reaction to this medicine and need smaller doses. Overdosage: If you think you have taken too much of this medicine contact a poison control center or emergency room at once. NOTE: This medicine is only for you. Do not share this medicine with others. What if I miss a dose? If you miss a dose, take it as soon as you can. If it is almost time for your next dose, take only that dose. Do not take double or extra doses. What may interact with this medicine? -herbal or dietary supplements -local or general anesthetics -medicines for high blood pressure -medicines for prostate problems -rifampin This list may not describe all possible interactions. Give your health care provider a list of all the medicines, herbs, non-prescription drugs, or dietary supplements you use. Also tell them if you smoke, drink alcohol, or use illegal drugs. Some items may interact with your medicine. What should I watch for while using this medicine? Visit your doctor or  health care professional for regular check ups. Check your blood pressure and pulse rate regularly. Ask your health care professional what your blood pressure and pulse rate should be, and when you should contact him or her. This medicine may make you feel confused, dizzy or lightheaded. Do not drive, use machinery, or do anything that needs mental alertness until you know how this medicine affects you. To reduce the risk of dizzy or fainting spells, do not sit or stand up quickly, especially if you are an older patient. Avoid alcoholic drinks; they can make you more dizzy. Do not suddenly stop taking amlodipine. Ask your doctor or health care professional how you can gradually reduce the dose. What side effects may I notice from receiving this medicine? Side effects that you should report to your doctor or health care professional as soon as possible: -allergic reactions like skin rash, itching or hives, swelling of the face, lips, or tongue -breathing problems -changes in vision or hearing -chest pain -fast, irregular heartbeat -swelling of legs or ankles Side effects that usually do not require medical attention (report to your doctor or health care professional if they continue or are bothersome): -dry mouth -facial flushing -nausea, vomiting -stomach gas, pain -tired, weak -trouble sleeping This list may not describe all possible side effects. Call your doctor for medical advice about side effects. You may report side effects to FDA at 1-800-FDA-1088. Where should I keep my medicine? Keep out of the reach of children. Store at room temperature between 59 and 86 degrees F (15 and 30 degrees C). Protect from light. Keep container tightly closed. Throw away any unused medicine after the expiration date. NOTE: This sheet is a summary. It may not cover all possible information. If you have questions about this medicine, talk to your doctor, pharmacist, or health care provider.    2016,  Elsevier/Gold Standard. (2012-02-26 11:40:58)

## 2015-09-11 NOTE — ED Notes (Signed)
Patient transported to X-ray 

## 2016-02-27 ENCOUNTER — Telehealth: Payer: Self-pay | Admitting: Internal Medicine

## 2016-02-27 DIAGNOSIS — I5041 Acute combined systolic (congestive) and diastolic (congestive) heart failure: Secondary | ICD-10-CM

## 2016-02-27 NOTE — Telephone Encounter (Signed)
Patient needs a refill for lisinopril, furosemide, and carvedilol. Patient is aware that he needs an appointment. Please follow up.

## 2016-02-28 MED ORDER — LISINOPRIL 20 MG PO TABS: 20.0000 mg | ORAL_TABLET | Freq: Every day | ORAL | 0 refills | Status: DC

## 2016-02-28 MED ORDER — CARVEDILOL 25 MG PO TABS: 25.0000 mg | ORAL_TABLET | Freq: Two times a day (BID) | ORAL | 0 refills | Status: DC

## 2016-02-28 MED FILL — CARVEDILOL 25 MG TABLET: 25 | 30 days supply | Qty: 60 | Fill #0

## 2016-02-28 MED FILL — ?LISINOPRIL 20 MG TABLET: 20 | 30 days supply | Qty: 30 | Fill #0

## 2016-02-28 NOTE — Telephone Encounter (Signed)
Lisinopril and carvedilol refilled. Will not refill furosemide until patient is seen.

## 2016-03-07 IMAGING — CR DG CHEST 1V PORT
1 series · 1 of 1 positions shown · non-contrast
Comparison: None.

CLINICAL DATA: 43-year-old male with shortness of breath for 2
weeks. Chest pain and hypertension. Initial encounter.

EXAM:
PORTABLE CHEST - 1 VIEW

[portable]
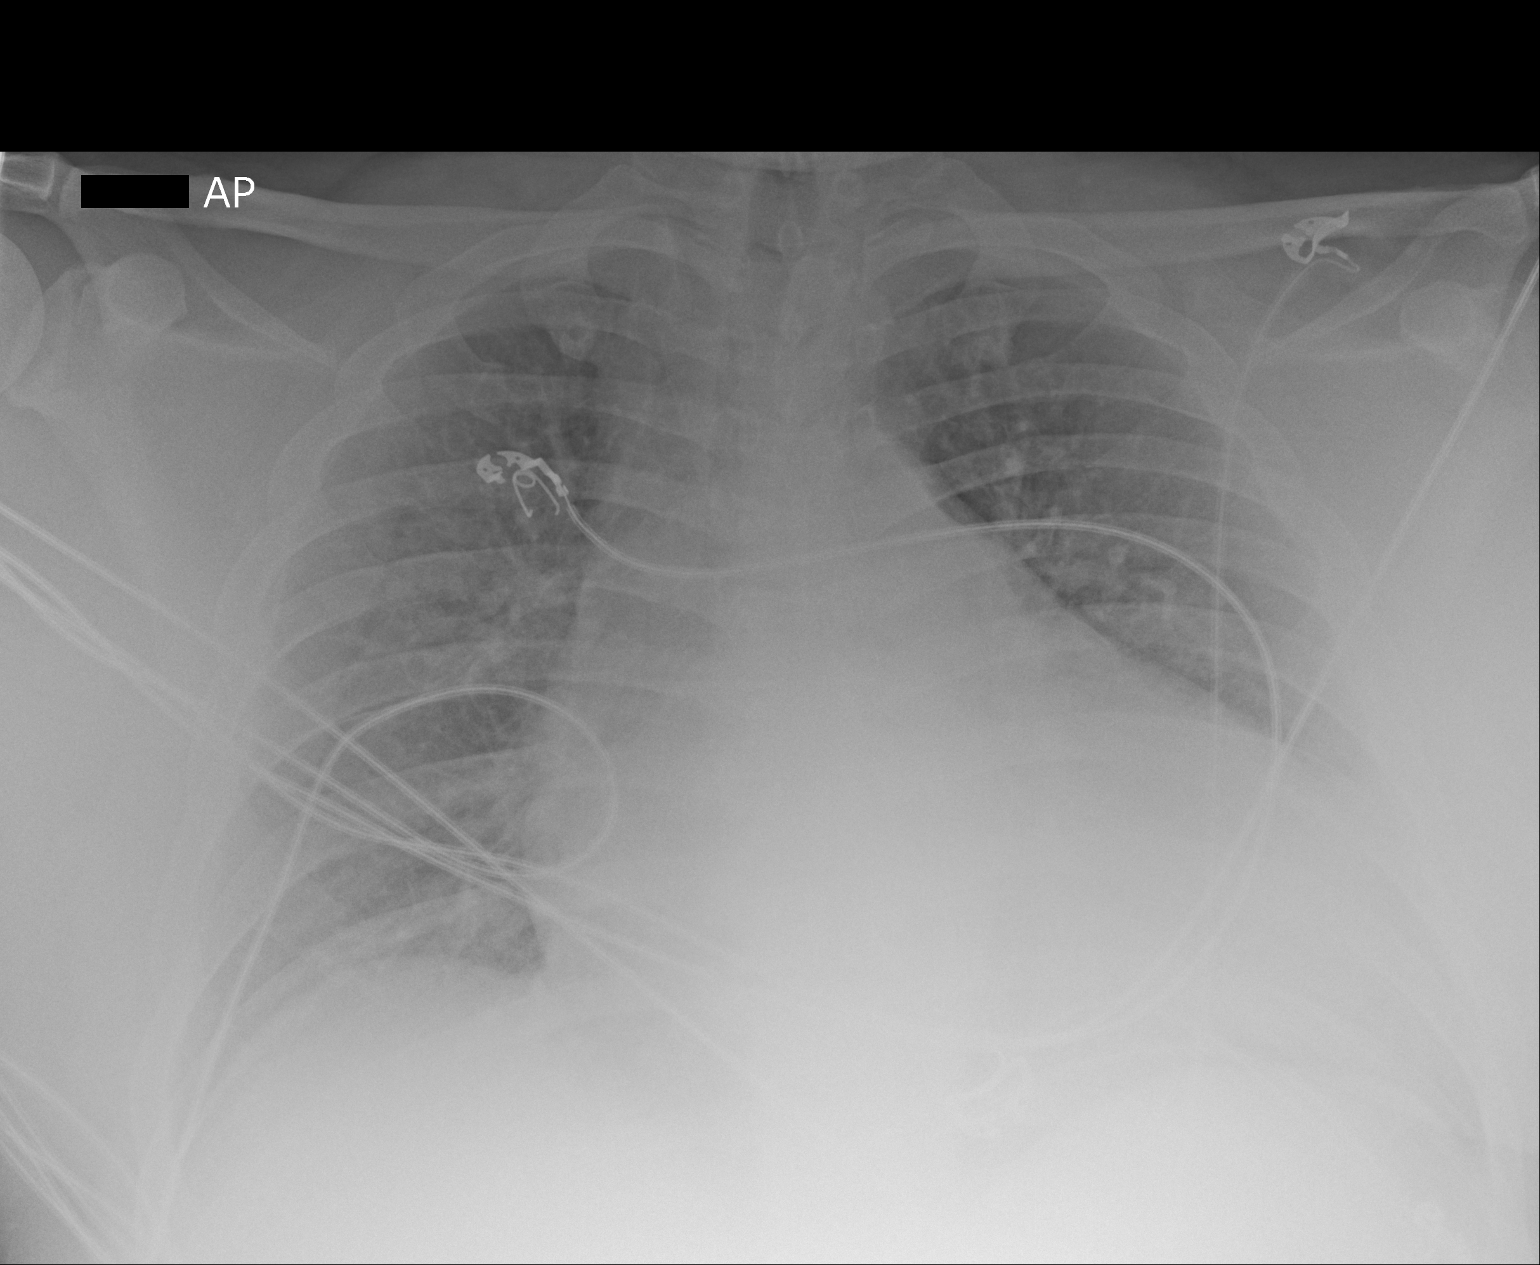

[1 of 1 positions shown; findings below may reference images not displayed]

FINDINGS: Large body habitus. Portable AP upright view at 8739 hours. There is
cardiomegaly. Other mediastinal contours are within normal limits.
Pulmonary vascular congestion. No pneumothorax. No consolidation or
large pleural effusion identified. Visualized tracheal air column is
within normal limits.
IMPRESSION: Large body habitus, cardiomegaly, and pulmonary vascular congestion.

## 2016-06-02 ENCOUNTER — Telehealth: Payer: Self-pay | Admitting: Internal Medicine

## 2016-06-02 NOTE — Telephone Encounter (Signed)
Patient needs to be seen before any refill is authorized

## 2016-06-02 NOTE — Telephone Encounter (Signed)
Pt. Came into facility requesting a refill for furosemide (LASIX) 80 MG tablet  Pt. Has not been since since 2016 and has scheduled an appt. For Monday. Pt. States he needs the medication and would like for [facility] to be [facility]. Please f/u with pt.

## 2016-06-02 NOTE — Telephone Encounter (Signed)
Please schedule the patient for an appointment. He may not receive refills until he is seen.

## 2016-06-03 ENCOUNTER — Other Ambulatory Visit: Payer: Self-pay | Admitting: Internal Medicine

## 2016-06-03 DIAGNOSIS — I5041 Acute combined systolic (congestive) and diastolic (congestive) heart failure: Secondary | ICD-10-CM

## 2016-06-08 ENCOUNTER — Telehealth: Payer: Self-pay

## 2016-06-08 ENCOUNTER — Encounter: Payer: Self-pay | Admitting: Family Medicine

## 2016-06-08 ENCOUNTER — Encounter: Payer: Self-pay | Admitting: Licensed Clinical Social Worker

## 2016-06-08 ENCOUNTER — Ambulatory Visit: Payer: No Typology Code available for payment source | Attending: Family Medicine | Admitting: Family Medicine

## 2016-06-08 VITALS — BP 156/107 | HR 75 | Temp 98.0°F | Resp 18 | Ht 70.0 in | Wt 339.4 lb

## 2016-06-08 DIAGNOSIS — Z7982 Long term (current) use of aspirin: Secondary | ICD-10-CM | POA: Insufficient documentation

## 2016-06-08 DIAGNOSIS — I5042 Chronic combined systolic (congestive) and diastolic (congestive) heart failure: Secondary | ICD-10-CM | POA: Insufficient documentation

## 2016-06-08 DIAGNOSIS — Z79899 Other long term (current) drug therapy: Secondary | ICD-10-CM | POA: Insufficient documentation

## 2016-06-08 DIAGNOSIS — M7989 Other specified soft tissue disorders: Secondary | ICD-10-CM | POA: Insufficient documentation

## 2016-06-08 DIAGNOSIS — Z0001 Encounter for general adult medical examination with abnormal findings: Secondary | ICD-10-CM | POA: Insufficient documentation

## 2016-06-08 DIAGNOSIS — H547 Unspecified visual loss: Secondary | ICD-10-CM

## 2016-06-08 DIAGNOSIS — I110 Hypertensive heart disease with heart failure: Secondary | ICD-10-CM | POA: Insufficient documentation

## 2016-06-08 DIAGNOSIS — I10 Essential (primary) hypertension: Secondary | ICD-10-CM

## 2016-06-08 DIAGNOSIS — F419 Anxiety disorder, unspecified: Secondary | ICD-10-CM

## 2016-06-08 LAB — BASIC METABOLIC PANEL WITH GFR
BUN: 20 mg/dL (ref 7–25)
CHLORIDE: 107 mmol/L (ref 98–110)
CO2: 25 mmol/L (ref 20–31)
Calcium: 8.9 mg/dL (ref 8.6–10.3)
Creat: 1.16 mg/dL (ref 0.60–1.35)
GFR, EST NON AFRICAN AMERICAN: 77 mL/min (ref >=60)
GFR, Est African American: 89 mL/min (ref >=60)
Glucose, Bld: 107 mg/dL — ABNORMAL HIGH (ref 65–99)
POTASSIUM: 4.4 mmol/L (ref 3.5–5.3)
SODIUM: 141 mmol/L (ref 135–146)

## 2016-06-08 LAB — LIPID PANEL
CHOL/HDL RATIO: 3.6 ratio (ref <5.0)
Cholesterol: 153 mg/dL (ref <200)
HDL: 43 mg/dL (ref >40)
LDL CALC: 84 mg/dL (ref <100)
TRIGLYCERIDES: 128 mg/dL (ref <150)
VLDL: 26 mg/dL (ref <30)

## 2016-06-08 MED ORDER — FUROSEMIDE 20 MG PO TABS: 20.0000 mg | ORAL_TABLET | Freq: Every day | ORAL | 2 refills | Status: DC

## 2016-06-08 MED ORDER — LISINOPRIL 20 MG PO TABS: 20.0000 mg | ORAL_TABLET | Freq: Every day | ORAL | 2 refills | Status: DC

## 2016-06-08 MED ORDER — CARVEDILOL 25 MG PO TABS: 25.0000 mg | ORAL_TABLET | Freq: Two times a day (BID) | ORAL | 2 refills | Status: DC

## 2016-06-08 MED ORDER — AMLODIPINE BESYLATE 10 MG PO TABS: 10.0000 mg | ORAL_TABLET | Freq: Every day | ORAL | 2 refills | Status: DC

## 2016-06-08 MED FILL — CARVEDILOL 25 MG TABLET: 25 | 30 days supply | Qty: 60 | Fill #0

## 2016-06-08 MED FILL — ?FUROSEMIDE 20 MG TABLET: 20 | 30 days supply | Qty: 30 | Fill #0

## 2016-06-08 MED FILL — ?LISINOPRIL 20 MG TABLET: 20 | 30 days supply | Qty: 30 | Fill #0

## 2016-06-08 MED FILL — AMLODIPINE BESYLATE 10 MG T: 10 | 30 days supply | Qty: 30 | Fill #0

## 2016-06-08 NOTE — BH Specialist Note (Signed)
Session Start time: 10:08 AM   End Time: 10:38 AM Total Time:  30 minutes Type of Service: Behavioral Health - Individual/Family Interpreter: No.   Interpreter Name & Language: N/A # [facility]-June 2018: [facility]st   SUBJECTIVE: Scott Livingston is a 46 y.o. male  Pt. was referred by FNP Hairston for:  anxiety and depression. Pt. reports the following symptoms/concerns: feelings of sadness and worry, difficulty sleeping, low energy/motivation, withdrawn behavior, racing thoughts, and irritability Duration of problem:  2-3 years Severity: moderate Previous treatment: none reported   OBJECTIVE: Mood: Anxious & Affect: Appropriate Risk of harm to self or others: Pt denied SI/HI/AVH Assessments administered: PHQ-9; GAD-7  LIFE CONTEXT:  Family & Social: Family resides with fiancee and 31fifteen year old daughter. 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 No report of additional substance use Life changes: [place] is originally from [place]. He was recently laid off in January 2018 and was denied unemployment. Pt grieves the loss of his step-mother (2014) What is important to pt/family (values): Family, Independence, Good Health   GOALS ADDRESSED:  Decrease symptoms of depression and anxiety Increase knowledge of coping skills  INTERVENTIONS: Solution Focused and Strength-based   ASSESSMENT:  Pt currently experiencing depression and anxiety triggered by financial strain and his ongoing medical conditions. Pt reports feelings of sadness and worry, difficulty sleeping, low energy/motivation, withdrawn behavior, racing thoughts, and irritability. Pt may benefit from psychoeducation, psychotherapy, and medication management. LCSWA educated pt on the cycle of depression and  anxiety and discussed healthy coping skills to decrease symptoms. Pt is not currently open to medication management or therapy; however, identified strategies that he can implement on a daily basis to cope with stressors. Pt verbalized need for CPAP machine to assist with his difficulty sleeping. LCSWA will provide pt's information to RN CM to refer him to the CPAP Assistance Program through [employer]. Pt was provided resources for crisis intervention, therapy, and medication management.      PLAN: 1. F/U with behavioral health clinician: Pt was encouraged to contact LCSWA if symptoms worsen or fail to improve to [facility]. 2. Behavioral Health meds: None reported 3. Behavioral recommendations: LCSWA recommends that pt apply healthy coping skills discussed and utilize resources as needed. Pt is encouraged to schedule follow up appointment with LCSWA 4. Referral: Brief Counseling/Psychotherapy, [facility], Problem-solving teaching/coping strategies, Psychoeducation and Supportive Counseling 5. From scale of 1-10, how likely are you to follow plan: 7/10   Bridgett LarssonJasmine D Lewis, MSW, [facility]  Clinical Social Worker 06/09/16 10:33 AM  Warmhandoff:   Warm Hand Off Completed.

## 2016-06-08 NOTE — Progress Notes (Signed)
Subjective:  Patient ID: Scott Livingston, male    DOB: 11/03/1970  Age: 46 y.o. MRN: [id number]  CC: Hypertension   HPI Scott Livingston presents for   1. Hypertension: Reports nonadherence with medications for hypertension due to financial constraints. Denies any chest pain or shortness of breath. He does reportintermittent bilateral lower leg swelling for one month.   2. History of heart failure. Reports nonadherence with medications for hypertension due to financial constraints. Denies any chest pain or shortness of breath. He does reportintermittent bilateral lower leg swelling for one month. He also reports history of sleep apnea. He reports sleep study performed a couple years ago and it was recommended he wear a CPAP. Reports never getting CPAP machine due to financial constraints.  3. Vision change: Reports decreased visual acuity several months. Denies any eye pain, double vision or headaches.  4. Anxiety: He reports anxious mood for several months. Stressors include financial limitations and his daughter doing poorly in school. He denies any SI/HI. He is agreeable to speaking with the LCSW at this time.   Outpatient Medications Prior to Visit  Medication Sig Dispense Refill  . Aspirin-Salicylamide-Caffeine (BC HEADACHE POWDER PO) Take 1 packet by mouth daily as needed (pain).    Marland Kitchen. ibuprofen (ADVIL,MOTRIN) 200 MG tablet Take 600 mg by mouth every 6 (six) hours as needed for moderate pain.    Marland Kitchen. amLODipine (NORVASC) 10 MG tablet Take 1 tablet (10 mg total) by mouth daily. 30 tablet 0  . carvedilol (COREG) 25 MG tablet Take 1 tablet (25 mg total) by mouth 2 (two) times daily. 60 tablet 0  . furosemide (LASIX) 80 MG tablet Take 2 tabs (160 mg) po at 8 am, and take 1 tab (80 mg) po at 2 pm every day. (Patient taking differently: Take 80 mg by mouth daily. ) 270 tablet 3  . lisinopril (PRINIVIL,ZESTRIL) 20 MG tablet Take 1 tablet (20 mg total) by mouth daily. 30 tablet 0  . spironolactone  (ALDACTONE) 25 MG tablet Take 1 tablet (25 mg total) by mouth daily. 90 tablet 3   No facility-administered medications prior to visit.     ROS Review of Systems  Eyes: Positive for visual disturbance.  Respiratory: Negative.   Cardiovascular: Positive for leg swelling.  Gastrointestinal: Negative.   Skin: Negative.     Objective:  BP (!) 156/107 (BP Location: Right Arm, Patient Position: Sitting, Cuff Size: Large)   Pulse 75   Temp 98 F (36.7 C) (Oral)   Resp 18   Ht 5\' 10"  (1.778 m)   Wt (!) 339 lb 6.4 oz (154 kg)   SpO2 96%   BMI 48.70 kg/m   BP/Weight 06/08/2016 09/11/2015 09/10/2015  Systolic BP 156 156 -  Diastolic BP 107 100 -  Wt. (Lbs) 339.4 - 310  BMI 48.7 - 43.26   Physical Exam  Eyes: Conjunctivae are normal. Pupils are equal, round, and reactive to light.  Neck: No JVD present.  Cardiovascular: Normal rate, regular rhythm, normal heart sounds and intact distal pulses.   Pulmonary/Chest: Effort normal and breath sounds normal.  Abdominal: Soft. Bowel sounds are normal.  Skin: Skin is warm and dry.  BLE non-pitting.  Nursing note and vitals reviewed.   Assessment & Plan:   Problem List Items Addressed This Visit      Cardiovascular and Mediastinum   Hypertension - Primary   Relevant Medications   lisinopril (PRINIVIL,ZESTRIL) 20 MG tablet   carvedilol (COREG) 25 MG tablet   furosemide (  LASIX) 20 MG tablet   amLODipine (NORVASC) 10 MG tablet   Other Relevant Orders   BASIC METABOLIC PANEL WITH GFR (Completed)   Microalbumin/Creatinine Ratio, Urine (Completed)   Lipid Panel (Completed)   Ambulatory referral to Ophthalmology    Other Visit Diagnoses    Chronic combined systolic and diastolic heart failure (HCC)       Relevant Medications   lisinopril (PRINIVIL,ZESTRIL) 20 MG tablet   carvedilol (COREG) 25 MG tablet   furosemide (LASIX) 20 MG tablet   amLODipine (NORVASC) 10 MG tablet   Other Relevant Orders   Ambulatory referral to Cardiology     ECHOCARDIOGRAM COMPLETE   Decreased visual acuity       Relevant Orders   Ambulatory referral to Ophthalmology   Anxious mood       -LCSW spoke with patient and provided resources.       Meds ordered this encounter  Medications  . lisinopril (PRINIVIL,ZESTRIL) 20 MG tablet    Sig: Take 1 tablet (20 mg total) by mouth daily.    Dispense:  30 tablet    Refill:  2    Order Specific Question:   Supervising Provider    Answer:   Quentin Angst [id number]  . carvedilol (COREG) 25 MG tablet    Sig: Take 1 tablet (25 mg total) by mouth 2 (two) times daily.    Dispense:  60 tablet    Refill:  2    Order Specific Question:   Supervising Provider    Answer:   Quentin Angst [id number]  . DISCONTD: amLODipine (NORVASC) 10 MG tablet    Sig: Take 1 tablet (10 mg total) by mouth daily.    Dispense:  30 tablet    Refill:  2    Order Specific Question:   Supervising Provider    Answer:   Quentin Angst [id number]  . furosemide (LASIX) 20 MG tablet    Sig: Take 1 tablet (20 mg total) by mouth daily.    Dispense:  30 tablet    Refill:  2    Order Specific Question:   Supervising Provider    Answer:   Quentin Angst [id number]  . amLODipine (NORVASC) 10 MG tablet    Sig: Take 1 tablet (10 mg total) by mouth daily.    Dispense:  30 tablet    Refill:  2    Order Specific Question:   Supervising Provider    Answer:   Quentin Angst [id number]    Follow-up: Return in about 2 weeks (around 06/22/2016) for BP check with nurse.   Lizbeth Bark FNP

## 2016-06-08 NOTE — Progress Notes (Signed)
Patient is here for HTN & med refills  Patient stated that he worries  too much & get nervous   Patient stated that is hard to catch up some breathe  Patient stated that he feels that starting to build up some fluids and his legs because it feels tingles   Patient has not eaten today  Patient have taking his BP meds this morning

## 2016-06-08 NOTE — Telephone Encounter (Signed)
CMA call to inform echocardiogram appt  Patient was aware and understood

## 2016-06-08 NOTE — Patient Instructions (Signed)
Complete financial paperwork for orange card to complete referral process Come back in 2 weeks for blood pressure check with nurse   Hypertension Hypertension is another name for high blood pressure. High blood pressure forces your heart to work harder to pump blood. A blood pressure reading has two numbers, which includes a higher number over a lower number (example: 110/72). Follow these instructions at home:  Have your blood pressure rechecked by your doctor.  Only take medicine as told by your doctor. Follow the directions carefully. The medicine does not work as well if you skip doses. Skipping doses also puts you at risk for problems.  Do not smoke.  Monitor your blood pressure at home as told by your doctor. Contact a doctor if:  You think you are having a reaction to the medicine you are taking.  You have repeat headaches or feel dizzy.  You have puffiness (swelling) in your ankles.  You have trouble with your vision. Get help right away if:  You get a very bad headache and are confused.  You feel weak, numb, or faint.  You get chest or belly (abdominal) pain.  You throw up (vomit).  You cannot breathe very well. This information is not intended to replace advice given to you by your health care provider. Make sure you discuss any questions you have with your health care provider. Document Released: 09/16/2007 Document Revised: 09/05/2015 Document Reviewed: 01/20/2013 Elsevier Interactive Patient Education  2017 [employer]

## 2016-06-09 LAB — MICROALBUMIN / CREATININE URINE RATIO
Creatinine, Urine: 222 mg/dL (ref 20–370)
MICROALB UR: 29.1 mg/dL
MICROALB/CREAT RATIO: 131 ug/mg{creat} — AB (ref <30)

## 2016-06-13 ENCOUNTER — Other Ambulatory Visit: Payer: Self-pay | Admitting: Family Medicine

## 2016-06-15 ENCOUNTER — Telehealth: Payer: Self-pay

## 2016-06-15 ENCOUNTER — Ambulatory Visit (HOSPITAL_COMMUNITY): Admission: RE | Admit: 2016-06-15 | Payer: No Typology Code available for payment source | Source: Ambulatory Visit

## 2016-06-15 ENCOUNTER — Other Ambulatory Visit: Payer: Self-pay | Admitting: Family Medicine

## 2016-06-15 DIAGNOSIS — Z9189 Other specified personal risk factors, not elsewhere classified: Secondary | ICD-10-CM

## 2016-06-15 MED ORDER — ROSUVASTATIN CALCIUM 10 MG PO TABS: 10.0000 mg | ORAL_TABLET | Freq: Every day | ORAL | 2 refills | Status: DC

## 2016-06-15 MED FILL — ROSUVASTATIN CALCIUM 10 MG: 10 | 30 days supply | Qty: 30 | Fill #0

## 2016-06-15 NOTE — Telephone Encounter (Signed)
CMA call to go over lab results  Patient Verify DOB  Patient was aware and understood   

## 2016-06-15 NOTE — Telephone Encounter (Signed)
-----   Message from Lizbeth BarkMandesia R Hairston, [place] sent at 06/15/2016 12:57 PM EST ----- Microalbumin/creatinine ratio level was normal. This tests for protein in your urine that can indicate early signs of kidney damage.  Kidney function normal Lipid levels were elevated. This can increase your risk of heart disease overtime. Start eating a diet low in saturated fat. Limit your intake of fried foods, red meats, and whole milk. Increase your physical activity.  You have been prescribed crestor to help lower your risk of heart disease. Recommend follow up in 3 months.

## 2016-06-15 NOTE — Telephone Encounter (Signed)
Completed CPAP application faxed to [employer] - fax # [phone]

## 2016-06-15 NOTE — Telephone Encounter (Signed)
Request received from Arrie SenateMandesia Hairston, FNP requesting a CPAP machine for the patient. He currently has no insurance.   Call placed to the patient and explained the process of obtaining a CPAP machine through the [employer] - CPAP Assistance Program. He was very appreciative of the information;  but is not able to afford the $100 donation at this time.   Application for CPAP assistance forwarded to M. Jenelle MagesHairston, FNP for completion.

## 2016-06-16 ENCOUNTER — Telehealth: Payer: Self-pay

## 2016-06-16 NOTE — Telephone Encounter (Signed)
Call placed to [employer] # [phone] to confirm receipt of the CPAP application and to pay the donation. Voicemail message left requesting a return call to # [phone].

## 2016-06-17 ENCOUNTER — Telehealth: Payer: Self-pay

## 2016-06-17 NOTE — Telephone Encounter (Signed)
Message received from [employer] ([employer]AA) requesting a call back.  Call returned to [employer] # [phone] and a HIPAA compliant voice mail message was left requesting a call back to # [phone] or [phone].

## 2016-06-22 ENCOUNTER — Telehealth: Payer: Self-pay

## 2016-06-22 NOTE — Telephone Encounter (Signed)
Call placed to the [employer] # [phone] and spoke to BerkeyValerie. $100 donation paid by [facility] for CPAP machine.

## 2016-07-01 ENCOUNTER — Ambulatory Visit: Payer: Self-pay | Attending: Family Medicine | Admitting: *Deleted

## 2016-07-01 ENCOUNTER — Other Ambulatory Visit: Payer: Self-pay | Admitting: Family Medicine

## 2016-07-01 VITALS — BP 144/98 | HR 78

## 2016-07-01 DIAGNOSIS — I10 Essential (primary) hypertension: Secondary | ICD-10-CM

## 2016-07-01 MED ORDER — LISINOPRIL 40 MG PO TABS: 40.0000 mg | ORAL_TABLET | Freq: Every day | ORAL | 2 refills | Status: DC

## 2016-07-01 MED FILL — LISINOPRIL 40 MG TABLET: 40 | 30 days supply | Qty: 30 | Fill #0

## 2016-07-01 NOTE — Progress Notes (Signed)
Pt here for f/u BP check. Pt denies chest pain, SOB, new vison concerns, or generalized swelling. He states he feels like he has headaches when his BP is elevated. He has been taking medications as prescribed. He is also taking  blood pressure at home. BP readings at home range from SBP: 150-165 and DBP: 105-110. He states his stressor is his daughter. Denies salt intake.   Blood pressure taken manually while patient is sitting.  BP 142/110 and 144/98. Will notify patient's provider of BP.   Medication adjustments made.   -Increase Lisinopril  40 mg daily.  - follow up in 2 weeks for BP check with RN. Appointment Wednesday 4/11@2 :30pm  Pt informed of new changes to medication. Pt  verbalized understanding.  Nurse visit will be routed to provider.Guy Francoravia Aayat Hajjar, RN, BSN

## 2016-07-01 NOTE — Addendum Note (Signed)
Addended by: Guy FrancoBENJAMIN, Khoa Opdahl on: 07/01/2016 03:33 PM   Modules accepted: Level of Service

## 2016-07-01 NOTE — Progress Notes (Unsigned)
Follow up BP check with RN today. Clinic RN reports BP is not at goal. Will increase dosage of lisinopril to 40 mg QD. Recommend repeat BP check with clinic RN.

## 2016-07-03 ENCOUNTER — Telehealth: Payer: Self-pay

## 2016-07-03 NOTE — Telephone Encounter (Signed)
CPAP delivered. [facility] to request a call back to schedule a CPAP teaching appointment for the patient.

## 2016-07-03 NOTE — Telephone Encounter (Signed)
Call received from [facility] in Respiratory Therapy and a CPAP teaching appointment was scheduled for 07/15/16 @ 1000.  Call placed to the patient and informed him of the CPAP teaching appointment at [facility] on 07/15/16 @ 1000. He stated that he would be there.

## 2016-07-07 MED FILL — ?FUROSEMIDE 20 MG TABLET: 20 | 30 days supply | Qty: 30 | Fill #1

## 2016-07-07 MED FILL — AMLODIPINE BESYLATE 10 MG T: 10 | 30 days supply | Qty: 30 | Fill #1

## 2016-07-14 ENCOUNTER — Telehealth: Payer: Self-pay

## 2016-07-14 NOTE — Telephone Encounter (Signed)
Call placed to the patient and confirmed his appointment for CPAP teaching at [facility] tomorrow  - 07/15/16 @ 1000.

## 2016-07-15 ENCOUNTER — Ambulatory Visit: Payer: Self-pay | Attending: Family Medicine

## 2016-07-16 ENCOUNTER — Telehealth: Payer: Self-pay

## 2016-07-16 NOTE — Telephone Encounter (Signed)
The patient completed CPAP teaching yesterday with Lysbeth Penner, RT

## 2016-07-22 ENCOUNTER — Ambulatory Visit: Payer: Self-pay | Attending: Family Medicine | Admitting: *Deleted

## 2016-07-22 ENCOUNTER — Other Ambulatory Visit: Payer: Self-pay | Admitting: Family Medicine

## 2016-07-22 VITALS — BP 149/94 | HR 64

## 2016-07-22 DIAGNOSIS — I10 Essential (primary) hypertension: Secondary | ICD-10-CM

## 2016-07-22 MED ORDER — HYDROCHLOROTHIAZIDE 25 MG PO TABS: 25.0000 mg | ORAL_TABLET | Freq: Every day | ORAL | 0 refills | Status: DC

## 2016-07-22 NOTE — Progress Notes (Unsigned)
BP was found to be elevated during nurse visit for BP check. Patient reports adherence with anti-hypertensive medication. Will discontinue lasix and and HTCZ 25 mg QD. Recommend following up for BP check again in 2 weeks.

## 2016-07-22 NOTE — Progress Notes (Addendum)
Pt here for f/u BP check. Pt denies chest pain, SOB, HA, new vison concerns, or generalized swelling.    Medication adjustments made 07/01/16 at nurse visit  -Increase Lisinopril  40 mg daily.  Pt states he takes medications as directed.   Blood pressure taken manually while patient is sitting:  140/92 , 148/98 P: 64  Pt states his BP is better than what he has gets at home, usually diastolic is higher "in 100-120's at home." Will notify patient provider.    Nurse visit will be routed to provider.Guy Franco, RN, BSN

## 2016-07-22 NOTE — Progress Notes (Signed)
Will discontinue lasix and add HTCZ 25 mg QD. Recommend following up for BP check again in 2 weeks. Please stress the importance of taking all of his medications for blood pressure. Avoid smoking, decrease sodium intake 2,000 to 4,000 mg per day, and increase physical activity to help better control his blood pressures.

## 2016-07-23 MED FILL — HYDROCHLOROTHIAZIDE 25 MG T: 25 | 30 days supply | Qty: 30 | Fill #0

## 2016-07-24 ENCOUNTER — Telehealth: Payer: Self-pay | Admitting: *Deleted

## 2016-07-24 NOTE — Telephone Encounter (Signed)
Pt aware of message from [facility].  Will discontinue lasix and add HTCZ 25 mg QD. Recommend following up for BP check again in 2 weeks. Please stress the importance of taking all of his medications for blood pressure. Avoid smoking, decrease sodium intake 2,000 to 4,000 mg per day, and increase physical activity to help better control his blood pressures.   Pt verbalized understanding. Apt scheduled for May 2, 2 2:30pm

## 2016-07-28 ENCOUNTER — Telehealth: Payer: Self-pay | Admitting: Family Medicine

## 2016-07-28 NOTE — Telephone Encounter (Signed)
Called patient today to reschedule the echo that he no showed for in March. Patient states he couldn't afford the test and don't wish to reschedule.

## 2016-08-12 ENCOUNTER — Ambulatory Visit: Payer: Self-pay | Attending: Family Medicine | Admitting: *Deleted

## 2016-08-12 VITALS — BP 148/100 | HR 67

## 2016-08-12 DIAGNOSIS — I10 Essential (primary) hypertension: Secondary | ICD-10-CM | POA: Insufficient documentation

## 2016-08-12 MED FILL — CARVEDILOL 25 MG TABLET: 25 | 30 days supply | Qty: 60 | Fill #1

## 2016-08-12 MED FILL — FUROSEMIDE 20 MG TABLET: 20 | 30 days supply | Qty: 30 | Fill #2

## 2016-08-12 MED FILL — ?AMLODIPINE BESYLATE 10 MG: 10 | 30 days supply | Qty: 30 | Fill #2

## 2016-08-12 MED FILL — HYDROCHLOROTHIAZIDE 25 MG T: 25 | 30 days supply | Qty: 30 | Fill #1

## 2016-08-12 NOTE — Progress Notes (Signed)
Pt brought in prescriptions with him to nurse visit.  While  going over patient medications, he states he does not take Lisinopril because he thought she discontinued this medication.  Pt was informed of last message discussed with him on 07/24/16 to discontinue lasix and add HCTZ 25. Pt admits that he takes both Lasix and HCTZ. Informed pt provider.  Verbal order for lab work: BMP with GFR placed.   Pt denies chest pain, SOB, HA, vision concerns. He states he has noticed that he urinates a lot. He also states he noticed some muscle twitching in his legs at times.  Scheduled an appointment on 08/13/16 at 0915. Pt verbalized understanding for apt.   Marland Kitchen

## 2016-08-13 ENCOUNTER — Ambulatory Visit: Payer: Self-pay | Attending: Family Medicine | Admitting: Family Medicine

## 2016-08-13 ENCOUNTER — Encounter: Payer: Self-pay | Admitting: Family Medicine

## 2016-08-13 VITALS — BP 156/91 | HR 72 | Temp 98.6°F | Resp 18 | Ht 71.0 in | Wt 350.4 lb

## 2016-08-13 DIAGNOSIS — I10 Essential (primary) hypertension: Secondary | ICD-10-CM

## 2016-08-13 DIAGNOSIS — Z79899 Other long term (current) drug therapy: Secondary | ICD-10-CM | POA: Insufficient documentation

## 2016-08-13 DIAGNOSIS — Z7982 Long term (current) use of aspirin: Secondary | ICD-10-CM | POA: Insufficient documentation

## 2016-08-13 DIAGNOSIS — I5042 Chronic combined systolic (congestive) and diastolic (congestive) heart failure: Secondary | ICD-10-CM | POA: Insufficient documentation

## 2016-08-13 DIAGNOSIS — I110 Hypertensive heart disease with heart failure: Secondary | ICD-10-CM | POA: Insufficient documentation

## 2016-08-13 DIAGNOSIS — Z9189 Other specified personal risk factors, not elsewhere classified: Secondary | ICD-10-CM

## 2016-08-13 LAB — BMP8+EGFR
BUN / CREAT RATIO: 16 (ref 9–20)
BUN: 15 mg/dL (ref 6–24)
CALCIUM: 9.2 mg/dL (ref 8.7–10.2)
CO2: 24 mmol/L (ref 18–29)
CREATININE: 0.92 mg/dL (ref 0.76–1.27)
Chloride: 99 mmol/L (ref 96–106)
GFR calc Af Amer: 117 mL/min/{1.73_m2} (ref >59)
GFR calc non Af Amer: 102 mL/min/{1.73_m2} (ref >59)
GLUCOSE: 161 mg/dL — AB (ref 65–99)
Potassium: 3.8 mmol/L (ref 3.5–5.2)
Sodium: 139 mmol/L (ref 134–144)

## 2016-08-13 LAB — POCT UA - MICROALBUMIN
CREATININE, POC: 200 mg/dL
MICROALBUMIN (UR) POC: 80 mg/L

## 2016-08-13 MED ORDER — CARVEDILOL 25 MG PO TABS: 25.0000 mg | ORAL_TABLET | Freq: Two times a day (BID) | ORAL | 2 refills | Status: DC

## 2016-08-13 MED ORDER — ROSUVASTATIN CALCIUM 10 MG PO TABS: 10.0000 mg | ORAL_TABLET | Freq: Every day | ORAL | 2 refills | Status: DC

## 2016-08-13 MED ORDER — LISINOPRIL 40 MG PO TABS: 40.0000 mg | ORAL_TABLET | Freq: Every day | ORAL | 2 refills | Status: DC

## 2016-08-13 MED ORDER — HYDROCHLOROTHIAZIDE 25 MG PO TABS: 25.0000 mg | ORAL_TABLET | Freq: Every day | ORAL | 0 refills | Status: DC

## 2016-08-13 MED ORDER — AMLODIPINE BESYLATE 10 MG PO TABS: 10.0000 mg | ORAL_TABLET | Freq: Every day | ORAL | 2 refills | Status: DC

## 2016-08-13 MED FILL — LISINOPRIL 40 MG TABLET: 40 | 30 days supply | Qty: 30 | Fill #0

## 2016-08-13 NOTE — Progress Notes (Signed)
Subjective:  Patient ID: Scott Bundenthony Gallery, male    DOB: 09/22/1970  Age: 46 y.o. MRN: [id number]  CC: No chief complaint on file.   HPI Scott Livingston presents for   Hypertension: Patient here for follow-up of elevated blood pressure. He is not exercising and is not adherent to low salt diet.  Blood pressure Patient does not check BP at home well controlled at home. Cardiac symptoms lower extremity edema. Patient denies chest pain, claudication, dyspnea, palpitations and syncope.  Cardiovascular risk factors: dyslipidemia, hypertension, male gender, obesity (BMI >= 30 kg/m2), sedentary lifestyle and smoking/ tobacco exposure. Use of agents associated with hypertension: none. History of target organ damage: heart failure. He reports not following up with previous echocardiogram or cardiology referral. He reports not taking medication as prescribed, he reports taking lasix and HCTZ. History of non-adherence with medications.    Outpatient Medications Prior to Visit  Medication Sig Dispense Refill  . aspirin EC 81 MG tablet Take 81 mg by mouth daily.    . Aspirin-Salicylamide-Caffeine (BC HEADACHE POWDER PO) Take 1 packet by mouth daily as needed (pain).    Marland Kitchen. ibuprofen (ADVIL,MOTRIN) 200 MG tablet Take 600 mg by mouth every 6 (six) hours as needed for moderate pain.    Marland Kitchen. amLODipine (NORVASC) 10 MG tablet Take 1 tablet (10 mg total) by mouth daily. 30 tablet 2  . carvedilol (COREG) 25 MG tablet Take 1 tablet (25 mg total) by mouth 2 (two) times daily. 60 tablet 2  . hydrochlorothiazide (HYDRODIURIL) 25 MG tablet Take 1 tablet (25 mg total) by mouth daily. 90 tablet 0  . lisinopril (PRINIVIL,ZESTRIL) 40 MG tablet Take 1 tablet (40 mg total) by mouth daily. 30 tablet 2  . rosuvastatin (CRESTOR) 10 MG tablet Take 1 tablet (10 mg total) by mouth daily. 30 tablet 2   No facility-administered medications prior to visit.     ROS Review of Systems  Constitutional: Negative.   Eyes: Negative.     Respiratory: Negative.   Cardiovascular: Positive for leg swelling.  Gastrointestinal: Negative.   Skin: Negative.   Neurological: Negative.   Psychiatric/Behavioral: Negative.     Objective:  BP (!) 156/91 (BP Location: Left Arm, Patient Position: Sitting, Cuff Size: Normal)   Pulse 72   Temp 98.6 F (37 C) (Oral)   Resp 18   Ht 5\' 11"  (1.803 m)   Wt (!) 350 lb 6.4 oz (158.9 kg)   SpO2 94%   BMI 48.87 kg/m   BP/Weight 08/13/2016 08/12/2016 07/22/2016  Systolic BP 156 148 149  Diastolic BP 91 100 94  Wt. (Lbs) 350.4 - -  BMI 48.87 - -   Physical Exam  Eyes: Conjunctivae are normal. Pupils are equal, round, and reactive to light.  Neck: No JVD present.  Cardiovascular: Normal rate, regular rhythm, normal heart sounds and intact distal pulses.   BLE ankle non-pitting.   Pulmonary/Chest: Effort normal and breath sounds normal.  Abdominal: Soft. Bowel sounds are normal.  Skin: Skin is warm and dry.  Nursing note and vitals reviewed.   Assessment & Plan:   Problem List Items Addressed This Visit      Cardiovascular and Mediastinum   Hypertension - Primary   Follow up with clinical pharmacist in 2 weeks   Follow up with PCP in 3 months   Relevant Medications   rosuvastatin (CRESTOR) 10 MG tablet   amLODipine (NORVASC) 10 MG tablet   carvedilol (COREG) 25 MG tablet   hydrochlorothiazide (HYDRODIURIL) 25 MG tablet  lisinopril (PRINIVIL,ZESTRIL) 40 MG tablet   Other Relevant Orders   POCT UA - Microalbumin (Completed)   Ambulatory referral to Cardiology    Other Visit Diagnoses    Chronic combined systolic (congestive) and diastolic (congestive) heart failure (HCC)       Relevant Medications   rosuvastatin (CRESTOR) 10 MG tablet   amLODipine (NORVASC) 10 MG tablet   carvedilol (COREG) 25 MG tablet   hydrochlorothiazide (HYDRODIURIL) 25 MG tablet   lisinopril (PRINIVIL,ZESTRIL) 40 MG tablet   Other Relevant Orders   Ambulatory referral to Cardiology   Candidate  for statin therapy due to risk of future cardiovascular event       Relevant Medications   rosuvastatin (CRESTOR) 10 MG tablet      Meds ordered this encounter  Medications  . rosuvastatin (CRESTOR) 10 MG tablet    Sig: Take 1 tablet (10 mg total) by mouth daily.    Dispense:  30 tablet    Refill:  2    Order Specific Question:   Supervising Provider    Answer:   Quentin Angst [id number]  . amLODipine (NORVASC) 10 MG tablet    Sig: Take 1 tablet (10 mg total) by mouth daily.    Dispense:  30 tablet    Refill:  2    Order Specific Question:   Supervising Provider    Answer:   Quentin Angst [id number]  . carvedilol (COREG) 25 MG tablet    Sig: Take 1 tablet (25 mg total) by mouth 2 (two) times daily.    Dispense:  60 tablet    Refill:  2    Order Specific Question:   Supervising Provider    Answer:   Quentin Angst [id number]  . hydrochlorothiazide (HYDRODIURIL) 25 MG tablet    Sig: Take 1 tablet (25 mg total) by mouth daily.    Dispense:  90 tablet    Refill:  0    Order Specific Question:   Supervising Provider    Answer:   Quentin Angst [id number]  . lisinopril (PRINIVIL,ZESTRIL) 40 MG tablet    Sig: Take 1 tablet (40 mg total) by mouth daily.    Dispense:  30 tablet    Refill:  2    Order Specific Question:   Supervising Provider    Answer:   Quentin Angst [id number]    Follow-up: Return in about 2 weeks (around 08/27/2016) for BP check with Stacy .   Lizbeth Bark FNP

## 2016-08-13 NOTE — Progress Notes (Signed)
Patient is here for f/up   Patient denies pain for today  

## 2016-08-13 NOTE — Patient Instructions (Addendum)
Heart Failure °Heart failure means your heart has trouble pumping blood. This makes it hard for your body to work well. Heart failure is usually a long-term (chronic) condition. You must take good care of yourself and follow your doctor's treatment plan. °Follow these instructions at home: °· Take your heart medicine as told by your doctor. °? Do not stop taking medicine unless your doctor tells you to. °? Do not skip any dose of medicine. °? Refill your medicines before they run out. °? Take other medicines only as told by your doctor or pharmacist. °· Stay active if told by your doctor. The elderly and people with severe heart failure should talk with a doctor about physical activity. °· Eat heart-healthy foods. Choose foods that are without trans fat and are low in saturated fat, cholesterol, and salt (sodium). This includes fresh or frozen fruits and vegetables, fish, lean meats, fat-free or low-fat dairy foods, whole grains, and high-fiber foods. Lentils and dried peas and beans (legumes) are also good choices. °· Limit salt if told by your doctor. °· Cook in a healthy way. Roast, grill, broil, bake, poach, steam, or stir-fry foods. °· Limit fluids as told by your doctor. °· Weigh yourself every morning. Do this after you pee (urinate) and before you eat breakfast. Write down your weight to give to your doctor. °· Take your blood pressure and write it down if your doctor tells you to. °· Ask your doctor how to check your pulse. Check your pulse as told. °· Lose weight if told by your doctor. °· Stop smoking or chewing tobacco. Do not use gum or patches that help you quit without your doctor's approval. °· Schedule and go to doctor visits as told. °· Nonpregnant women should have no more than 1 drink a day. Men should have no more than 2 drinks a day. Talk to your doctor about drinking alcohol. °· Stop illegal drug use. °· Stay current with shots (immunizations). °· Manage your health conditions as told by your  doctor. °· Learn to manage your stress. °· Rest when you are tired. °· If it is really hot outside: °? Avoid intense activities. °? Use air conditioning or fans, or get in a cooler place. °? Avoid caffeine and alcohol. °? Wear loose-fitting, lightweight, and light-colored clothing. °· If it is really cold outside: °? Avoid intense activities. °? Layer your clothing. °? Wear mittens or gloves, a hat, and a scarf when going outside. °? Avoid alcohol. °· Learn about heart failure and get support as needed. °· Get help to maintain or improve your quality of life and your ability to care for yourself as needed. °Contact a doctor if: °· You gain weight quickly. °· You are more short of breath than usual. °· You cannot do your normal activities. °· You tire easily. °· You cough more than normal, especially with activity. °· You have any or more puffiness (swelling) in areas such as your hands, feet, ankles, or belly (abdomen). °· You cannot sleep because it is hard to breathe. °· You feel like your heart is beating fast (palpitations). °· You get dizzy or light-headed when you stand up. °Get help right away if: °· You have trouble breathing. °· There is a change in mental status, such as becoming less alert or not being able to focus. °· You have chest pain or discomfort. °· You faint. °This information is not intended to replace advice given to you by your health care provider. Make sure you   discuss any questions you have with your health care provider. °Document Released: 01/07/2008 Document Revised: 09/05/2015 Document Reviewed: 05/16/2012 °Elsevier Interactive Patient Education © 2017 Elsevier Inc. ° °

## 2016-08-19 ENCOUNTER — Ambulatory Visit: Payer: Self-pay

## 2016-08-19 ENCOUNTER — Telehealth: Payer: Self-pay

## 2016-08-19 NOTE — Telephone Encounter (Signed)
CMA call regarding lab results  Patient did not answer but left a detailed message  

## 2016-08-19 NOTE — Telephone Encounter (Signed)
-----   Message from Lizbeth BarkMandesia R Hairston, [place] sent at 08/19/2016 12:53 PM EDT ----- Labs that evaluated your fluid and electrolyte balance are normal. Potassium level which is important for heart function is normal. Kidney function normal

## 2016-08-27 ENCOUNTER — Encounter: Payer: Self-pay | Admitting: Pharmacist

## 2016-09-29 MED FILL — AMLODIPINE BESYLATE 10 MG T: 10 | 30 days supply | Qty: 30 | Fill #0

## 2016-09-29 MED FILL — HYDROCHLOROTHIAZIDE 25 MG T: 25 | 30 days supply | Qty: 30 | Fill #2

## 2016-09-29 MED FILL — CARVEDILOL 25 MG TABLET: 25 | 30 days supply | Qty: 60 | Fill #2

## 2016-09-29 MED FILL — LISINOPRIL 40 MG TABLET: 40 | 30 days supply | Qty: 30 | Fill #1

## 2016-10-07 ENCOUNTER — Ambulatory Visit: Payer: Self-pay | Admitting: Cardiology

## 2016-12-15 MED FILL — AMLODIPINE BESYLATE 10 MG T: 10 | 30 days supply | Qty: 30 | Fill #1

## 2016-12-15 MED FILL — ?CARVEDILOL 25 MG TABLET: 25 | 30 days supply | Qty: 60 | Fill #0

## 2016-12-15 MED FILL — HYDROCHLOROTHIAZIDE 25 MG T: 25 | 30 days supply | Qty: 30 | Fill #0

## 2016-12-15 MED FILL — LISINOPRIL 40 MG TABLET: 40 | 30 days supply | Qty: 30 | Fill #2

## 2017-01-27 MED FILL — HYDROCHLOROTHIAZIDE 25 MG T: 25 | 30 days supply | Qty: 30 | Fill #1

## 2017-01-27 MED FILL — AMLODIPINE BESYLATE 10 MG T: 10 | 30 days supply | Qty: 30 | Fill #2

## 2017-01-27 MED FILL — ?CARVEDILOL 25 MG TABLET: 25 | 30 days supply | Qty: 60 | Fill #1

## 2017-01-27 MED FILL — LISINOPRIL 40 MG TABLET: 40 | 30 days supply | Qty: 30 | Fill #1

## 2017-02-15 ENCOUNTER — Telehealth: Payer: Self-pay

## 2017-02-15 NOTE — Telephone Encounter (Signed)
Opened in error

## 2017-03-03 ENCOUNTER — Telehealth: Payer: Self-pay | Admitting: Family Medicine

## 2017-03-03 ENCOUNTER — Other Ambulatory Visit: Payer: Self-pay | Admitting: Family Medicine

## 2017-03-03 DIAGNOSIS — I10 Essential (primary) hypertension: Secondary | ICD-10-CM

## 2017-03-03 MED ORDER — HYDROCHLOROTHIAZIDE 25 MG PO TABS: 25.0000 mg | ORAL_TABLET | Freq: Every day | ORAL | 2 refills | Status: DC

## 2017-03-03 MED ORDER — LISINOPRIL 40 MG PO TABS: 40.0000 mg | ORAL_TABLET | Freq: Every day | ORAL | 2 refills | Status: DC

## 2017-03-03 MED ORDER — CARVEDILOL 25 MG PO TABS: 25.0000 mg | ORAL_TABLET | Freq: Two times a day (BID) | ORAL | 2 refills | Status: DC

## 2017-03-03 MED FILL — HYDROCHLOROTHIAZIDE 25 MG T: 25 | 30 days supply | Qty: 30 | Fill #2

## 2017-03-03 MED FILL — CARVEDILOL 25 MG TABLET: 25 | 30 days supply | Qty: 60 | Fill #2

## 2017-03-03 MED FILL — LISINOPRIL 40 MG TAB: 40 | 30 days supply | Qty: 30 | Fill #2

## 2017-03-03 MED FILL — LISINOPRIL 40 MG TAB: 40 | 30 days supply | Qty: 30 | Fill #0

## 2017-03-03 MED FILL — AMLODIPINE BESYLATE 10 MG T: 10 | 60 days supply | Qty: 60 | Fill #0

## 2017-03-03 MED FILL — HYDROCHLOROTHIAZIDE 25 MG T: 25 | 30 days supply | Qty: 30 | Fill #0

## 2017-03-03 MED FILL — CARVEDILOL 25 MG TABLET: 25 | 30 days supply | Qty: 60 | Fill #0

## 2017-03-03 NOTE — Telephone Encounter (Signed)
Refilled. Patient last seen in May 2018. Recommend he schedule an office visit.

## 2017-03-03 NOTE — Telephone Encounter (Signed)
CMA call regarding [facility]  Patient was aware and understood

## 2017-03-03 NOTE — Telephone Encounter (Signed)
Pt came in to request a refill -carvedilol (COREG) 25 MG tablet -lisinopril (PRINIVIL,ZESTRIL) 40 MG tablet  -amLODipine (NORVASC) 10 MG tablet  -hydrochlorothiazide (HYDRODIURIL) 25 MG tablet  He needs these medications for two months, he does not return town in two months and would like an approval notice.he needs these medicines by tuesday Please follow up

## 2017-05-17 ENCOUNTER — Ambulatory Visit: Payer: Self-pay | Attending: Family Medicine | Admitting: Family Medicine

## 2017-05-17 ENCOUNTER — Other Ambulatory Visit: Payer: Self-pay

## 2017-05-17 ENCOUNTER — Encounter: Payer: Self-pay | Admitting: Family Medicine

## 2017-05-17 VITALS — BP 127/85 | HR 70 | Temp 98.2°F | Resp 16 | Ht 71.0 in | Wt 365.0 lb

## 2017-05-17 DIAGNOSIS — Z8679 Personal history of other diseases of the circulatory system: Secondary | ICD-10-CM

## 2017-05-17 DIAGNOSIS — Z131 Encounter for screening for diabetes mellitus: Secondary | ICD-10-CM

## 2017-05-17 DIAGNOSIS — I110 Hypertensive heart disease with heart failure: Secondary | ICD-10-CM | POA: Insufficient documentation

## 2017-05-17 DIAGNOSIS — E785 Hyperlipidemia, unspecified: Secondary | ICD-10-CM | POA: Insufficient documentation

## 2017-05-17 DIAGNOSIS — I10 Essential (primary) hypertension: Secondary | ICD-10-CM

## 2017-05-17 DIAGNOSIS — Z13220 Encounter for screening for lipoid disorders: Secondary | ICD-10-CM

## 2017-05-17 DIAGNOSIS — I509 Heart failure, unspecified: Secondary | ICD-10-CM | POA: Insufficient documentation

## 2017-05-17 DIAGNOSIS — Z7982 Long term (current) use of aspirin: Secondary | ICD-10-CM | POA: Insufficient documentation

## 2017-05-17 DIAGNOSIS — Z6841 Body Mass Index (BMI) 40.0 and over, adult: Secondary | ICD-10-CM

## 2017-05-17 DIAGNOSIS — E119 Type 2 diabetes mellitus without complications: Secondary | ICD-10-CM

## 2017-05-17 DIAGNOSIS — Z79899 Other long term (current) drug therapy: Secondary | ICD-10-CM | POA: Insufficient documentation

## 2017-05-17 DIAGNOSIS — R635 Abnormal weight gain: Secondary | ICD-10-CM

## 2017-05-17 LAB — POCT GLYCOSYLATED HEMOGLOBIN (HGB A1C): HEMOGLOBIN A1C: 6.9

## 2017-05-17 MED ORDER — HYDROCHLOROTHIAZIDE 25 MG PO TABS: 25.0000 mg | ORAL_TABLET | Freq: Every day | ORAL | 2 refills | Status: DC

## 2017-05-17 MED ORDER — EMPAGLIFLOZIN 25 MG PO TABS: 25.0000 mg | ORAL_TABLET | Freq: Every day | ORAL | 2 refills | Status: DC

## 2017-05-17 MED ORDER — AMLODIPINE BESYLATE 10 MG PO TABS: 10.0000 mg | ORAL_TABLET | Freq: Every day | ORAL | 2 refills | Status: DC

## 2017-05-17 MED ORDER — CARVEDILOL 25 MG PO TABS: 25.0000 mg | ORAL_TABLET | Freq: Two times a day (BID) | ORAL | 2 refills | Status: DC

## 2017-05-17 MED ORDER — LISINOPRIL 40 MG PO TABS: 40.0000 mg | ORAL_TABLET | Freq: Every day | ORAL | 2 refills | Status: DC

## 2017-05-17 MED ORDER — GLIPIZIDE 10 MG PO TABS: 10.0000 mg | ORAL_TABLET | Freq: Every day | ORAL | 2 refills | Status: DC

## 2017-05-17 MED ORDER — TRUEPLUS LANCETS 28G MISC: 1.0000 | Freq: Once | 12 refills | Status: AC

## 2017-05-17 MED ORDER — GLUCOSE BLOOD VI STRP: ORAL_STRIP | 12 refills | Status: DC

## 2017-05-17 MED ORDER — TRUE METRIX METER W/DEVICE KIT: 1.0000 | PACK | Freq: Once | 0 refills | Status: AC

## 2017-05-17 MED ORDER — PHENTERMINE-TOPIRAMATE ER 7.5-46 MG PO CP24: 1.0000 | ORAL_CAPSULE | Freq: Every day | ORAL | 1 refills | Status: DC

## 2017-05-17 MED FILL — LISINOPRIL 40 MG TAB: 40 | 30 days supply | Qty: 30 | Fill #0

## 2017-05-17 MED FILL — TRUEplus LANCETS 28G MISC: 30 days supply | Qty: 100 | Fill #0

## 2017-05-17 MED FILL — ?GLIPIZIDE 10 MG TABLET: 10 | 30 days supply | Qty: 30 | Fill #0

## 2017-05-17 MED FILL — **JARDIANCE 25 MG TABLET: 25 | 14 days supply | Qty: 14 | Fill #0

## 2017-05-17 MED FILL — HYDROCHLOROTHIAZIDE 25 MG T: 25 | 30 days supply | Qty: 30 | Fill #0

## 2017-05-17 MED FILL — !TRUE METRIX BLOOD GLUCOSE: 1 days supply | Qty: 1 | Fill #0

## 2017-05-17 MED FILL — ?CARVEDILOL 25 MG TABLET: 25 | 30 days supply | Qty: 60 | Fill #0

## 2017-05-17 MED FILL — TRUE METRIX TEST STRIP: 30 days supply | Qty: 100 | Fill #0

## 2017-05-17 MED FILL — AMLODIPINE BESYLATE 10 MG T: 10 | 30 days supply | Qty: 30 | Fill #0

## 2017-05-17 NOTE — Progress Notes (Signed)
Patient is for medication refills and concerns with his weight.

## 2017-05-17 NOTE — Progress Notes (Signed)
Subjective:  Patient ID: Scott Livingston, male    DOB: 14-Apr-1970  Age: 47 y.o. MRN: [id number]  CC: Medication Refill and Weight Check   HPI Sudais Banghart presents for medication refills and weight concerns. PMH significant for htn, chf .  He is exercising 3 to 4 times a week about 1 to 2 hours for a few months. He is adherent to low salt diet. He reports lower carb intake. He  does not check BP at home. Cardiac symptoms none. He denies any lower extremity edema, chronic cough, crackles,  chest pain, claudication, dyspnea, palpitations and syncope.  Cardiovascular risk factors: dyslipidemia, hypertension, male gender, obesity (BMI >= 30 kg/m2), sedentary lifestyle and smoking/ tobacco exposure. Use of agents associated with hypertension: none. History of target organ damage: heart failure. He reports not following up with previous echocardiogram.He reports difficulty losing weight with dietary and activity interventions. He reports polydipsia drinking large amount of water daily.    Outpatient Medications Prior to Visit  Medication Sig Dispense Refill  . aspirin EC 81 MG tablet Take 81 mg by mouth daily.    Marland Kitchen ibuprofen (ADVIL,MOTRIN) 200 MG tablet Take 600 mg by mouth every 6 (six) hours as needed for moderate pain.    Marland Kitchen amLODipine (NORVASC) 10 MG tablet TAKE 1 TABLET BY MOUTH DAILY. 30 tablet 2  . carvedilol (COREG) 25 MG tablet Take 1 tablet (25 mg total) by mouth 2 (two) times daily. 60 tablet 2  . hydrochlorothiazide (HYDRODIURIL) 25 MG tablet Take 1 tablet (25 mg total) by mouth daily. 30 tablet 2  . lisinopril (PRINIVIL,ZESTRIL) 40 MG tablet Take 1 tablet (40 mg total) by mouth daily. 30 tablet 2  . rosuvastatin (CRESTOR) 10 MG tablet Take 1 tablet (10 mg total) by mouth daily. (Patient not taking: Reported on 05/17/2017) 30 tablet 2  . Aspirin-Salicylamide-Caffeine (BC HEADACHE POWDER PO) Take 1 packet by mouth daily as needed (pain).     No facility-administered medications prior to visit.      ROS Review of Systems  Constitutional:       Obesity  Respiratory: Negative.   Cardiovascular: Negative.   Gastrointestinal: Negative.   Skin: Negative.     Objective:  BP 127/85 (BP Location: Right Arm, Patient Position: Sitting, Cuff Size: Large)   Pulse 70   Temp 98.2 F (36.8 C) (Oral)   Resp 16   Ht '5\' 11"'  (1.803 m)   Wt (!) 365 lb (165.6 kg)   SpO2 95%   BMI 50.91 kg/m   BP/Weight 05/17/2017 06/17/1222 07/20/7528  Systolic BP 051 102 111  Diastolic BP 85 91 735  Wt. (Lbs) 365 350.4 -  BMI 50.91 48.87 -     Physical Exam  Constitutional:  obesity  Eyes: Conjunctivae are normal. Pupils are equal, round, and reactive to light.  Neck: No JVD present.  Cardiovascular: Normal rate, regular rhythm, normal heart sounds and intact distal pulses.  Pulmonary/Chest: Effort normal and breath sounds normal.  Abdominal: Soft. Bowel sounds are normal.  Skin: Skin is warm and dry.  Nursing note and vitals reviewed.  Diabetic Foot Exam - Simple   Simple Foot Form Diabetic Foot exam was performed with the following findings:  Yes 05/17/2017 12:00 PM  Visual Inspection No deformities, no ulcerations, no other skin breakdown bilaterally:  Yes Sensation Testing Intact to touch and monofilament testing bilaterally:  Yes Pulse Check Posterior Tibialis and Dorsalis pulse intact bilaterally:  Yes Comments       Assessment & Plan:  1. Newly diagnosed diabetes ([place]) Start checking CBG's . Bring glucometer or blood glucose log to next office visit. - Basic metabolic panel - Lipid Panel - POCT glycosylated hemoglobin (Hb A1C) - empagliflozin (JARDIANCE) 25 MG TABS tablet; Take 25 mg by mouth daily.  Dispense: 30 tablet; Refill: 2 - glipiZIDE (GLUCOTROL) 10 MG tablet; Take 1 tablet (10 mg total) by mouth daily before breakfast.  Dispense: 30 tablet; Refill: 2 - Blood Glucose Monitoring Suppl (TRUE METRIX METER) w/Device KIT; 1 Device by Does not apply route once for 1 dose.   Dispense: 1 kit; Refill: 0 - TRUEPLUS LANCETS 28G MISC; 1 kit by Does not apply route once for 1 dose.  Dispense: 100 each; Refill: 12 - glucose blood test strip; Use as instructed  Dispense: 100 each; Refill: 12 - Ambulatory referral to Ophthalmology  2. Morbid obesity with BMI of 50.0-59.9, adult (HCC)  - Amb ref to Medical Nutrition Therapy-MNT - Phentermine-Topiramate 7.5-46 MG CP24; Take 1 tablet by mouth daily.  Dispense: 30 capsule; Refill: 1  3. Weight gain  - TSH  4. Screening for diabetes mellitus (DM) Due to obesity and polydipsia obtain hgba1c screen. - POCT glycosylated hemoglobin (Hb A1C)  5. History of CHF (congestive heart failure)  - ECHOCARDIOGRAM COMPLETE; Future - empagliflozin (JARDIANCE) 25 MG TABS tablet; Take 25 mg by mouth daily.  Dispense: 30 tablet; Refill: 2  6. Essential hypertension  - Basic metabolic panel - amLODipine (NORVASC) 10 MG tablet; Take 1 tablet (10 mg total) by mouth daily.  Dispense: 30 tablet; Refill: 2 - hydrochlorothiazide (HYDRODIURIL) 25 MG tablet; Take 1 tablet (25 mg total) by mouth daily.  Dispense: 30 tablet; Refill: 2 - carvedilol (COREG) 25 MG tablet; Take 1 tablet (25 mg total) by mouth 2 (two) times daily.  Dispense: 60 tablet; Refill: 2 - lisinopril (PRINIVIL,ZESTRIL) 40 MG tablet; Take 1 tablet (40 mg total) by mouth daily.  Dispense: 30 tablet; Refill: 2  7. Screening cholesterol level  - Lipid Panel       Follow-up: Return in about 6 weeks (around 06/28/2017) for Obesity .   Alfonse Spruce FNP

## 2017-05-17 NOTE — Patient Instructions (Addendum)
Start checking CBG's . Bring glucometer or blood glucose log to next office visit. www.diabetes.org ( American Diabetes Association)   Type 2 Diabetes Mellitus, Diagnosis, Adult Type 2 diabetes (type 2 diabetes mellitus) is a long-term (chronic) disease. It may be caused by one or both of these problems:  Your body does not make enough of a hormone called insulin.  Your body does not react in a normal way to insulin that it makes.  Insulin lets sugars (glucose) go into cells in the body. This gives you energy. If you have type 2 diabetes, sugars cannot get into cells. This causes high blood sugar (hyperglycemia). Your doctor will set treatment goals for you. Generally, you should have these blood sugar levels:  Before meals (preprandial): 80-130 mg/dL (1.6-1.0 mmol/L).  After meals (postprandial): below 180 mg/dL (10 mmol/L).  A1c (hemoglobin A1c) level: less than 7%.  Follow these instructions at home: Questions to Ask Your Doctor  You may want to ask these questions:  Do I need to meet with a diabetes educator?  Where can I find a support group for people with diabetes?  What equipment will I need to care for myself at home?  What diabetes medicines do I need? When should I take them?  How often do I need to check my blood sugar?  What number can I call if I have questions?  When is my next doctor's visit?  General instructions  Take over-the-counter and prescription medicines only as told by your doctor.  Keep all follow-up visits as told by your doctor. This is important. Contact a doctor if:  Your blood sugar is at or above 240 mg/dL (96.0 mmol/L) for 2 days in a row.  You have been sick or have had a fever for 2 days or more and you are not getting better.  You have any of these problems for more than 6 hours: ? You cannot eat or drink. ? You feel sick to your stomach (nauseous). ? You throw up (vomit). ? You have watery poop (diarrhea). Get help right  away if:  Your blood sugar is lower than 54 mg/dL (3 mmol/L).  You get confused.  You have trouble: ? Thinking clearly. ? Breathing.  You have moderate or large ketone levels in your pee (urine). This information is not intended to replace advice given to you by your health care provider. Make sure you discuss any questions you have with your health care provider. Document Released: 01/07/2008 Document Revised: 09/05/2015 Document Reviewed: 05/03/2015 Elsevier Interactive Patient Education  2018 [employer].    Obesity, Adult Obesity is the condition of having too much total body fat. Being overweight or obese means that your weight is greater than what is considered healthy for your body size. Obesity is determined by a measurement called BMI. BMI is an estimate of body fat and is calculated from height and weight. For adults, a BMI of 30 or higher is considered obese. Obesity can eventually lead to other health concerns and major illnesses, including:  Stroke.  Coronary artery disease (CAD).  Type 2 diabetes.  Some types of cancer, including cancers of the colon, breast, uterus, and gallbladder.  Osteoarthritis.  High blood pressure (hypertension).  High cholesterol.  Sleep apnea.  Gallbladder stones.  Infertility problems.  What are the causes? The main cause of obesity is taking in (consuming) more calories than your body uses for energy. Other factors that contribute to this condition may include:  Being born with genes that  make you more likely to become obese.  Having a medical condition that causes obesity. These conditions include: ? Hypothyroidism. ? Polycystic ovarian syndrome (PCOS). ? Binge-eating disorder. ? Cushing syndrome.  Taking certain medicines, such as steroids, antidepressants, and seizure medicines.  Not being physically active (sedentary lifestyle).  Living where there are limited places to exercise safely or buy healthy foods.  Not  getting enough sleep.  What increases the risk? The following factors may increase your risk of this condition:  Having a family history of obesity.  Being a woman of African-American descent.  Being a man of Hispanic descent.  What are the signs or symptoms? Having excessive body fat is the main symptom of this condition. How is this diagnosed? This condition may be diagnosed based on:  Your symptoms.  Your medical history.  A physical exam. Your health care provider may measure: ? Your BMI. If you are an adult with a BMI between 25 and less than 30, you are considered overweight. If you are an adult with a BMI of 30 or higher, you are considered obese. ? The distances around your hips and your waist (circumferences). These may be compared to each other to help diagnose your condition. ? Your skinfold thickness. Your health care provider may gently pinch a fold of your skin and measure it.  How is this treated? Treatment for this condition often includes changing your lifestyle. Treatment may include some or all of the following:  Dietary changes. Work with your health care provider and a dietitian to set a weight-loss goal that is healthy and reasonable for you. Dietary changes may include eating: ? Smaller portions. A portion size is the amount of a particular food that is healthy for you to eat at one time. This varies from person to person. ? Low-calorie or low-fat options. ? More whole grains, fruits, and vegetables.  Regular physical activity. This may include aerobic activity (cardio) and strength training.  Medicine to help you lose weight. Your health care provider may prescribe medicine if you are unable to lose 1 pound a week after 6 weeks of eating more healthily and doing more physical activity.  Surgery. Surgical options may include gastric banding and gastric bypass. Surgery may be done if: ? Other treatments have not helped to improve your condition. ? You have  a BMI of 40 or higher. ? You have life-threatening health problems related to obesity.  Follow these instructions at home:  Eating and drinking   Follow recommendations from your health care provider about what you eat and drink. Your health care provider may advise you to: ? Limit fast foods, sweets, and processed snack foods. ? Choose low-fat options, such as low-fat milk instead of whole milk. ? Eat 5 or more servings of fruits or vegetables every day. ? Eat at home more often. This gives you more control over what you eat. ? Choose healthy foods when you eat out. ? Learn what a healthy portion size is. ? Keep low-fat snacks on hand. ? Avoid sugary drinks, such as soda, fruit juice, iced tea sweetened with sugar, and flavored milk. ? Eat a healthy breakfast.  Drink enough water to keep your urine clear or pale yellow.  Do not go without eating for long periods of time (do not fast) or follow a fad diet. Fasting and fad diets can be unhealthy and even dangerous. Physical Activity  Exercise regularly, as told by your health care provider. Ask your health care provider what  types of exercise are safe for you and how often you should exercise.  Warm up and stretch before being active.  Cool down and stretch after being active.  Rest between periods of activity. Lifestyle  Limit the time that you spend in front of your TV, computer, or video game system.  Find ways to reward yourself that do not involve food.  Limit alcohol intake to no more than 1 drink a day for nonpregnant women and 2 drinks a day for men. One drink equals 12 oz of beer, 5 oz of wine, or 1 oz of hard liquor. General instructions  Keep a weight loss journal to keep track of the food you eat and how much you exercise you get.  Take over-the-counter and prescription medicines only as told by your health care provider.  Take vitamins and supplements only as told by your health care provider.  Consider  joining a support group. Your health care provider may be able to recommend a support group.  Keep all follow-up visits as told by your health care provider. This is important. Contact a health care provider if:  You are unable to meet your weight loss goal after 6 weeks of dietary and lifestyle changes. This information is not intended to replace advice given to you by your health care provider. Make sure you discuss any questions you have with your health care provider. Document Released: 05/07/2004 Document Revised: 09/02/2015 Document Reviewed: 01/16/2015 Elsevier Interactive Patient Education  2018 [employer].

## 2017-05-21 ENCOUNTER — Ambulatory Visit: Payer: Self-pay | Attending: Family Medicine

## 2017-05-21 DIAGNOSIS — E119 Type 2 diabetes mellitus without complications: Secondary | ICD-10-CM | POA: Insufficient documentation

## 2017-05-21 DIAGNOSIS — I10 Essential (primary) hypertension: Secondary | ICD-10-CM | POA: Insufficient documentation

## 2017-05-21 DIAGNOSIS — R635 Abnormal weight gain: Secondary | ICD-10-CM | POA: Insufficient documentation

## 2017-05-21 NOTE — Progress Notes (Signed)
Patient here for lab visit only 

## 2017-05-22 LAB — BASIC METABOLIC PANEL
BUN/Creatinine Ratio: 19 (ref 9–20)
BUN: 19 mg/dL (ref 6–24)
CO2: 24 mmol/L (ref 20–29)
CREATININE: 1.02 mg/dL (ref 0.76–1.27)
Calcium: 9.6 mg/dL (ref 8.7–10.2)
Chloride: 102 mmol/L (ref 96–106)
GFR calc Af Amer: 103 mL/min/{1.73_m2} (ref >59)
GFR calc non Af Amer: 89 mL/min/{1.73_m2} (ref >59)
GLUCOSE: 142 mg/dL — AB (ref 65–99)
Potassium: 4.4 mmol/L (ref 3.5–5.2)
Sodium: 144 mmol/L (ref 134–144)

## 2017-05-22 LAB — LIPID PANEL
CHOL/HDL RATIO: 4.7 ratio (ref 0.0–5.0)
Cholesterol, Total: 194 mg/dL (ref 100–199)
HDL: 41 mg/dL (ref >39)
LDL CALC: 115 mg/dL — AB (ref 0–99)
Triglycerides: 190 mg/dL — ABNORMAL HIGH (ref 0–149)
VLDL CHOLESTEROL CAL: 38 mg/dL (ref 5–40)

## 2017-05-22 LAB — TSH: TSH: 1.29 u[IU]/mL (ref 0.450–4.500)

## 2017-05-24 ENCOUNTER — Other Ambulatory Visit: Payer: Self-pay | Admitting: Family Medicine

## 2017-05-24 DIAGNOSIS — E782 Mixed hyperlipidemia: Secondary | ICD-10-CM

## 2017-05-24 MED ORDER — ATORVASTATIN CALCIUM 20 MG PO TABS: 20.0000 mg | ORAL_TABLET | Freq: Every day | ORAL | 2 refills | Status: DC

## 2017-05-28 ENCOUNTER — Ambulatory Visit: Payer: Self-pay

## 2017-05-28 ENCOUNTER — Telehealth (INDEPENDENT_AMBULATORY_CARE_PROVIDER_SITE_OTHER): Payer: Self-pay | Admitting: *Deleted

## 2017-05-28 NOTE — Telephone Encounter (Signed)
Medical Assistant left message on patient's home and cell voicemail. Voicemail states to give [place] [facility] back [facility] [place] [facility] at [phone]. !!!Please inform patient of cholesterol being elevated and needing to take atorvastatin daily. Patient will have a recheck completed in 3 months. Liver and kidney function is normal!!!

## 2017-05-28 NOTE — Telephone Encounter (Signed)
-----   Message from Lizbeth BarkMandesia R Hairston, FNP sent at 05/24/2017  8:34 AM EST ----- Cholesterol levels are elevated. Lipid levels were elevated. This can increase your risk of heart disease overtime. You will be prescribed atorvastatin. Start eating a diet low in saturated fat. Limit your intake of fried foods, red meats, and whole milk. Recommend f/u in 3 months.  Liver function normal Kidney function normal

## 2017-07-02 ENCOUNTER — Ambulatory Visit: Payer: Self-pay | Admitting: Family Medicine

## 2017-07-06 ENCOUNTER — Ambulatory Visit: Payer: Self-pay | Admitting: Family Medicine

## 2017-07-19 ENCOUNTER — Ambulatory Visit: Payer: Self-pay | Admitting: Registered"

## 2017-08-11 ENCOUNTER — Encounter: Payer: Self-pay | Admitting: Family Medicine

## 2017-08-11 ENCOUNTER — Ambulatory Visit: Payer: Self-pay | Attending: Family Medicine | Admitting: Family Medicine

## 2017-08-11 VITALS — BP 132/84 | HR 107 | Temp 98.2°F | Ht 71.0 in | Wt 361.4 lb

## 2017-08-11 DIAGNOSIS — I10 Essential (primary) hypertension: Secondary | ICD-10-CM

## 2017-08-11 MED FILL — HYDROCHLOROTHIAZIDE 25 MG T: 25 | 30 days supply | Qty: 30 | Fill #1

## 2017-08-11 MED FILL — LISINOPRIL 40 MG TAB: 40 | 30 days supply | Qty: 30 | Fill #1

## 2017-08-11 MED FILL — AMLODIPINE BESYLATE 10 MG T: 10 | 30 days supply | Qty: 30 | Fill #1

## 2017-08-11 MED FILL — CARVEDILOL 25 MG TABLET: 25 | 30 days supply | Qty: 60 | Fill #1

## 2017-08-11 NOTE — Progress Notes (Signed)
Patient left prior to being seen by MD

## 2017-11-17 MED FILL — AMLODIPINE BESYLATE 10 MG T: 10 | 30 days supply | Qty: 30 | Fill #2

## 2017-11-17 MED FILL — LISINOPRIL 40 MG TABLET: 40 | 30 days supply | Qty: 30 | Fill #1

## 2017-11-17 MED FILL — CARVEDILOL 25 MG TABLET: 25 | 30 days supply | Qty: 60 | Fill #1

## 2017-11-17 MED FILL — HYDROCHLOROTHIAZIDE 25 MG T: 25 | 30 days supply | Qty: 30 | Fill #2

## 2017-12-19 ENCOUNTER — Encounter (HOSPITAL_COMMUNITY): Payer: Self-pay | Admitting: Emergency Medicine

## 2017-12-19 ENCOUNTER — Emergency Department (HOSPITAL_COMMUNITY)
Admission: EM | Admit: 2017-12-19 | Discharge: 2017-12-19 | Disposition: A | Discharge status: Home / Self Care | Payer: PRIVATE HEALTH INSURANCE | Attending: Emergency Medicine | Admitting: Emergency Medicine

## 2017-12-19 DIAGNOSIS — Z79899 Other long term (current) drug therapy: Secondary | ICD-10-CM | POA: Insufficient documentation

## 2017-12-19 DIAGNOSIS — Z7984 Long term (current) use of oral hypoglycemic drugs: Secondary | ICD-10-CM | POA: Insufficient documentation

## 2017-12-19 DIAGNOSIS — I5043 Acute on chronic combined systolic (congestive) and diastolic (congestive) heart failure: Secondary | ICD-10-CM | POA: Diagnosis not present

## 2017-12-19 DIAGNOSIS — F17210 Nicotine dependence, cigarettes, uncomplicated: Secondary | ICD-10-CM | POA: Insufficient documentation

## 2017-12-19 DIAGNOSIS — R509 Fever, unspecified: Secondary | ICD-10-CM | POA: Diagnosis present

## 2017-12-19 DIAGNOSIS — I110 Hypertensive heart disease with heart failure: Secondary | ICD-10-CM | POA: Insufficient documentation

## 2017-12-19 DIAGNOSIS — B349 Viral infection, unspecified: Secondary | ICD-10-CM | POA: Diagnosis not present

## 2017-12-19 DIAGNOSIS — Z7982 Long term (current) use of aspirin: Secondary | ICD-10-CM | POA: Insufficient documentation

## 2017-12-19 LAB — CK TOTAL AND CKMB (NOT AT ARMC)
CK, MB: 1.1 ng/mL (ref 0.5–5.0)
RELATIVE INDEX: 0.8 (ref 0.0–2.5)
Total CK: 137 U/L (ref 49–397)

## 2017-12-19 LAB — CBC WITH DIFFERENTIAL/PLATELET
ABS IMMATURE GRANULOCYTES: 0 10*3/uL (ref 0.0–0.1)
Basophils Absolute: 0 10*3/uL (ref 0.0–0.1)
Basophils Relative: 0 %
EOS ABS: 0.4 10*3/uL (ref 0.0–0.7)
Eosinophils Relative: 5 %
HEMATOCRIT: 45 % (ref 39.0–52.0)
HEMOGLOBIN: 14.3 g/dL (ref 13.0–17.0)
IMMATURE GRANULOCYTES: 0 %
LYMPHS ABS: 1.4 10*3/uL (ref 0.7–4.0)
LYMPHS PCT: 15 %
MCH: 27.8 pg (ref 26.0–34.0)
MCHC: 31.8 g/dL (ref 30.0–36.0)
MCV: 87.5 fL (ref 78.0–100.0)
MONOS PCT: 8 %
Monocytes Absolute: 0.7 10*3/uL (ref 0.1–1.0)
NEUTROS PCT: 72 %
Neutro Abs: 6.3 10*3/uL (ref 1.7–7.7)
Platelets: 199 10*3/uL (ref 150–400)
RBC: 5.14 MIL/uL (ref 4.22–5.81)
RDW: 15 % (ref 11.5–15.5)
WBC: 8.9 10*3/uL (ref 4.0–10.5)

## 2017-12-19 LAB — COMPREHENSIVE METABOLIC PANEL
ALT: 91 U/L — AB (ref 0–44)
AST: 42 U/L — AB (ref 15–41)
Albumin: 3.2 g/dL — ABNORMAL LOW (ref 3.5–5.0)
Alkaline Phosphatase: 108 U/L (ref 38–126)
Anion gap: 10 (ref 5–15)
BUN: 12 mg/dL (ref 6–20)
CHLORIDE: 102 mmol/L (ref 98–111)
CO2: 27 mmol/L (ref 22–32)
CREATININE: 1 mg/dL (ref 0.61–1.24)
Calcium: 9 mg/dL (ref 8.9–10.3)
GFR calc non Af Amer: >60 mL/min (ref >60)
Glucose, Bld: 156 mg/dL — ABNORMAL HIGH (ref 70–99)
POTASSIUM: 3.7 mmol/L (ref 3.5–5.1)
SODIUM: 139 mmol/L (ref 135–145)
Total Bilirubin: 0.5 mg/dL (ref 0.3–1.2)
Total Protein: 6.7 g/dL (ref 6.5–8.1)

## 2017-12-19 LAB — I-STAT CG4 LACTIC ACID, ED: LACTIC ACID, VENOUS: 1.14 mmol/L (ref 0.5–1.9)

## 2017-12-19 LAB — URINALYSIS, ROUTINE W REFLEX MICROSCOPIC
Bilirubin Urine: NEGATIVE
Glucose, UA: NEGATIVE mg/dL
Hgb urine dipstick: NEGATIVE
Ketones, ur: NEGATIVE mg/dL
LEUKOCYTES UA: NEGATIVE
NITRITE: NEGATIVE
PH: 8 (ref 5.0–8.0)
Protein, ur: NEGATIVE mg/dL
SPECIFIC GRAVITY, URINE: 1.018 (ref 1.005–1.030)

## 2017-12-19 MED ORDER — IBUPROFEN 800 MG PO TABS
800.0000 mg | ORAL_TABLET | Freq: Once | ORAL | Status: AC
  Administered 2017-12-19: 800 mg via ORAL
  Filled 2017-12-19: qty 1

## 2017-12-19 NOTE — Discharge Instructions (Addendum)
Your labs all came back normal today.  As we discussed, your symptoms are likely from a virus. You should start to feel better over the next 3-5 days. You may use over-the-counter cold and flu products for relief. Tylenol and ibuprofen can be used for pain relief. Also applying warm or cold compresses to your body may be helpful as well.

## 2017-12-19 NOTE — ED Provider Notes (Signed)
[facility] EMERGENCY DEPARTMENT Provider Note  CSN: [id number] Arrival date & time: 12/19/17  1351  History   Chief Complaint Chief Complaint  Patient presents with  . Generalized Body Aches  . Back Pain  . Neck Pain   HPI Scott Livingston is a 47 y.o. male with a medical history of CHF, HTN, Type 2 DM and OSA who presented to the ED for myalgias x3 days. Associated symptoms: fever and chills. He woke up with these symptoms on Friday 12/17/17 morning. There are no specific relieving or worsening factors. Denies upper respiratory complaints, chest pain, SOB, cough, leg swelling, palpitations, fatigue. Denies abdominal pain, N/V, change in bowel habits or urinary complaints. Patient has tried Alka-Seltzer with moderate relief. Denies new exposures, recent travel or known sick contacts, but reports exposure to many people at his job.   Past Medical History:  Diagnosis Date  . Acute CHF (congestive heart failure) (HCC)    [name] 06/05/2014  . Arthritis    "knees" (06/05/2014)  . Headache    "maybe monthly" (06/05/2014)  . Hypercholesteremia   . Hypertension   . Myocardial infarction (HCC) ~ 2010   "mild; I lived in [place]"  . Shortness of breath dyspnea   . Sleep apnea    "never received a mask" (06/05/2014)    Patient Active Problem List   Diagnosis Date Noted  . Hypertension 06/08/2016  . Bilateral knee pain 02/06/2015  . Sleep apnea 01/21/2015  . Morbid obesity with BMI of 45.0-49.9, adult (HCC) 10/31/2014  . Acute combined systolic and diastolic congestive heart failure (HCC)   . Exertional chest pain 10/17/2014  . Systolic and diastolic CHF, acute on chronic (HCC) 10/11/2014  . Hypertensive cardiomyopathy (HCC)   . Demand ischemia of myocardium (HCC)   . Hypertensive urgency 06/04/2014  . Acute CHF (HCC) 06/04/2014  . Obesity 06/04/2014  . DOE (dyspnea on exertion) 06/04/2014  . Lower extremity edema 06/04/2014  . Hypokalemia 06/04/2014  . Elevated troponin  06/04/2014    Past Surgical History:  Procedure Laterality Date  . CARDIAC CATHETERIZATION N/A 10/18/2014   Procedure: Right/Left Heart Cath and Coronary Angiography;  Surgeon: Marykay Lex, MD;  Location: [facility] INVASIVE CV LAB;  Service: Cardiovascular;  Laterality: N/A;  . NO PAST SURGERIES          Home Medications    Prior to Admission medications   Medication Sig Start Date End Date Taking? Authorizing Provider  amLODipine (NORVASC) 10 MG tablet Take 1 tablet (10 mg total) by mouth daily. 05/17/17   Lizbeth Bark, FNP  aspirin EC 81 MG tablet Take 81 mg by mouth daily.    [provider]  atorvastatin (LIPITOR) 20 MG tablet Take 1 tablet (20 mg total) by mouth daily. 05/24/17   Lizbeth Bark, FNP  carvedilol (COREG) 25 MG tablet Take 1 tablet (25 mg total) by mouth 2 (two) times daily. 05/17/17   Lizbeth Bark, FNP  empagliflozin (JARDIANCE) 25 MG TABS tablet Take 25 mg by mouth daily. 05/17/17   Lizbeth Bark, FNP  glipiZIDE (GLUCOTROL) 10 MG tablet Take 1 tablet (10 mg total) by mouth daily before breakfast. 05/17/17   Arrie Senate R, FNP  glucose blood test strip Use as instructed 05/17/17   Lizbeth Bark, FNP  hydrochlorothiazide (HYDRODIURIL) 25 MG tablet Take 1 tablet (25 mg total) by mouth daily. 05/17/17   Lizbeth Bark, FNP  ibuprofen (ADVIL,MOTRIN) 200 MG tablet Take 600 mg by mouth every  6 (six) hours as needed for moderate pain.    [provider]  lisinopril (PRINIVIL,ZESTRIL) 40 MG tablet Take 1 tablet (40 mg total) by mouth daily. 05/17/17   Lizbeth Bark, FNP  Phentermine-Topiramate 7.5-46 MG CP24 Take 1 tablet by mouth daily. 05/17/17   Lizbeth Bark, FNP  rosuvastatin (CRESTOR) 10 MG tablet Take 1 tablet (10 mg total) by mouth daily. Patient not taking: Reported on 05/17/2017 08/13/16   Lizbeth Bark, FNP    Family History Family History  Problem Relation Age of Onset  . Diabetes Father   .  Congestive Heart Failure Father   . Hypertension Mother     Social History Social History   Tobacco Use  . Smoking status: Current Every Day Smoker    Packs/day: 0.12    Years: 20.00    Pack years: 2.40    Types: Cigarettes  . Smokeless tobacco: Never Used  . Tobacco comment: "stopped smoking in ~ 2010-2011"  Substance Use Topics  . Alcohol use: Yes    Comment: 06/05/2014 "might have a few drinks/month"  . Drug use: No     Allergies   Patient has no known allergies.   Review of Systems Review of Systems  Constitutional: Positive for chills and fever. Negative for appetite change, diaphoresis and fatigue.  HENT: Negative for congestion, ear pain, postnasal drip, rhinorrhea, sinus pain and sore throat.   Respiratory: Negative for cough and shortness of breath.   Cardiovascular: Negative for chest pain, palpitations and leg swelling.  Gastrointestinal: Negative.   Genitourinary: Negative.   Musculoskeletal: Positive for myalgias. Negative for arthralgias and gait problem.  Skin: Negative.   Neurological: Negative.   Hematological: Negative.    Physical Exam Updated Vital Signs BP (!) 173/94 (BP Location: Right Arm)   Pulse 82   Temp 98.5 F (36.9 C) (Oral)   Resp (!) 25   Ht 5\' 11"  (1.803 m)   Wt (!) 163.3 kg   SpO2 99%   BMI 50.21 kg/m   Physical Exam  Constitutional: No distress.  Obese. Sitting comfortably on bed, but grimaces with movements.  HENT:  Head: Normocephalic and atraumatic.  Right Ear: Tympanic membrane and ear canal normal.  Left Ear: Tympanic membrane and ear canal normal.  Mouth/Throat: Uvula is midline, oropharynx is clear and moist and mucous membranes are normal.  Eyes: Pupils are equal, round, and reactive to light. Conjunctivae, EOM and lids are normal.  Neck: Normal range of motion and full passive range of motion without pain. Neck supple. No spinous process tenderness and no muscular tenderness present. No neck rigidity. Normal range  of motion present.  Cardiovascular: Normal rate, regular rhythm and normal heart sounds. Exam reveals no friction rub.  No murmur heard. Pulses:      Radial pulses are 2+ on the right side, and 2+ on the left side.       Dorsalis pedis pulses are 2+ on the right side, and 2+ on the left side.       Posterior tibial pulses are 2+ on the right side, and 2+ on the left side.  Pulmonary/Chest: Effort normal and breath sounds normal. No tachypnea. No respiratory distress. He has no decreased breath sounds. He has no wheezes.  Abdominal: Soft. Normal appearance and bowel sounds are normal. There is no tenderness.  Musculoskeletal:  Full ROM in upper and lower extremities bilaterally. 5/5 strength in lower extremities. 4/5 strength in upper extremities endorsing back pain with movements. No bony  tenderness of extremities or spine. No muscular tenderness on palpation.  Lymphadenopathy:    He has no cervical adenopathy.  Skin: Skin is warm and intact. Capillary refill takes less than 2 seconds. No rash noted. He is not diaphoretic. No pallor.  Nursing note and vitals reviewed.  ED Treatments / Results  Labs (all labs ordered are listed, but only abnormal results are displayed) Labs Reviewed  COMPREHENSIVE METABOLIC PANEL - Abnormal; Notable for the following components:      Result Value   Glucose, Bld 156 (*)    Albumin 3.2 (*)    AST 42 (*)    ALT 91 (*)    All other components within normal limits  CBC WITH DIFFERENTIAL/PLATELET  URINALYSIS, ROUTINE W REFLEX MICROSCOPIC  CK TOTAL AND CKMB (NOT AT [facility])  I-STAT CG4 LACTIC ACID, ED  I-STAT CG4 LACTIC ACID, ED    EKG None  Radiology No results found.  Procedures Procedures (including critical care time)  Medications Ordered in ED Medications  ibuprofen (ADVIL,MOTRIN) tablet 800 mg (800 mg Oral Given 12/19/17 1601)     Initial Impression / Assessment and Plan / ED Course  Triage vital signs and the nursing notes have been  reviewed.  Pertinent labs & imaging results that were available during care of the patient were reviewed and considered in medical decision making (see chart for details).  Patient presents with a  2 day history of myalgias, fever and chills. He is afebrile and well appearing. He denies upper respiratory, GI, cardiac or pulmonary complaints that would point to a specific etiologic. Denies recent travel, new exposures or sick contacts. Patient had decreased upper extremity strength stating resistance was too painful. Remaining physical exam unremarkable. Denies family history of muscular dystrophy or recent history of trauma or crush injuries to suggest rhabdomyolysis. Likely viral etiology to complaints, but will order CK along with basic labs to further evaluate for more acute process.  Clinical Course as of Dec 19 1729  Wynelle Link Dec 19, 2017  1626 Labs unremarkable. Ordered CK after physical exam to evaluate for muscular damage.   [GM]  1711 Normal CK.   [GM]    Clinical Course User Index [GM] Carlyn Mullenbach, Sharyon Medicus, PA-C   Final Clinical Impressions(s) / ED Diagnoses  1. Viral Illness. Education provided on OTC and supportive treatment for relief.  Dispo: Home. After thorough clinical evaluation, this patient is determined to be medically stable and can be safely discharged with the previously mentioned treatment and/or outpatient follow-up/referral(s). At this time, there are no other apparent medical conditions that require further screening, evaluation or treatment.   Final diagnoses:  Viral illness    ED Discharge Orders    None        Windy Carina, [place] 12/19/17 1731    Eber Hong, MD 12/21/17 [phone]

## 2017-12-19 NOTE — ED Notes (Signed)
Pt didn't want to chg into gown.

## 2017-12-19 NOTE — ED Triage Notes (Addendum)
Pt states he feels sore all over, has body aches, sweats, fever chills. Pt has pain to back, hands, entire legs and neck. Denies any injuries, denies abdominal pain, denies urinary symptoms, denies respiratory symptoms. Denies chest pain, denies swelling or edema. Pt states "I think I have the flu, but my throat doesn't hurt."

## 2018-01-07 MED FILL — LISINOPRIL 40 MG TABLET: 40 | 30 days supply | Qty: 30 | Fill #2

## 2018-01-07 MED FILL — HYDROCHLOROTHIAZIDE 25 MG T: 25 | 30 days supply | Qty: 30 | Fill #1

## 2018-01-07 MED FILL — CARVEDILOL 25 MG TABLET: 25 | 30 days supply | Qty: 60 | Fill #2

## 2018-01-11 ENCOUNTER — Other Ambulatory Visit: Payer: Self-pay

## 2018-01-11 DIAGNOSIS — I10 Essential (primary) hypertension: Secondary | ICD-10-CM

## 2018-01-11 MED ORDER — AMLODIPINE BESYLATE 10 MG PO TABS: 10.0000 mg | ORAL_TABLET | Freq: Every day | ORAL | 0 refills | Status: DC

## 2018-03-07 ENCOUNTER — Telehealth: Payer: Self-pay | Admitting: Family Medicine

## 2018-03-07 DIAGNOSIS — I10 Essential (primary) hypertension: Secondary | ICD-10-CM

## 2018-03-07 MED ORDER — HYDROCHLOROTHIAZIDE 25 MG PO TABS: 25.0000 mg | ORAL_TABLET | Freq: Every day | ORAL | 0 refills | Status: DC

## 2018-03-07 MED ORDER — CARVEDILOL 25 MG PO TABS: 25.0000 mg | ORAL_TABLET | Freq: Two times a day (BID) | ORAL | 0 refills | Status: DC

## 2018-03-07 MED ORDER — LISINOPRIL 40 MG PO TABS: 40.0000 mg | ORAL_TABLET | Freq: Every day | ORAL | 0 refills | Status: DC

## 2018-03-07 MED FILL — AMLODIPINE BESYLATE 10 MG T: 10 | 30 days supply | Qty: 30 | Fill #0

## 2018-03-07 NOTE — Telephone Encounter (Signed)
Each requested medication can be refilled for a 30 day supply. Please resend to me if needed as a RX request

## 2018-03-07 NOTE — Telephone Encounter (Signed)
1) Medication(s) Requested (by name): -lisinopril (PRINIVIL,ZESTRIL) 40 MG tablet  -hydrochlorothiazide (HYDRODIURIL) 25 MG tablet  -carvedilol (COREG) 25 MG tablet   2) Pharmacy of Choice: [facility] pharmacy 3) Special Requests:   Approved medications will be sent to the pharmacy, we will reach out if there is an issue.  Requests made after 3pm may not be addressed until the following business day!  If a patient is unsure of the name of the medication(s) please note and ask patient to call back when they are able to provide all info, do not send to responsible party until all information is available!

## 2018-03-16 MED FILL — CARVEDILOL 25 MG TABLET: 25 | 30 days supply | Qty: 60 | Fill #0

## 2018-03-16 MED FILL — LISINOPRIL 40 MG TABLET: 40 | 30 days supply | Qty: 30 | Fill #0

## 2018-03-16 MED FILL — HYDROCHLOROTHIAZIDE 25 MG T: 25 | 30 days supply | Qty: 30 | Fill #0

## 2018-03-23 ENCOUNTER — Encounter: Payer: Self-pay | Admitting: Family Medicine

## 2018-03-23 ENCOUNTER — Ambulatory Visit: Payer: PRIVATE HEALTH INSURANCE | Attending: Family Medicine | Admitting: Family Medicine

## 2018-03-23 VITALS — BP 146/89 | HR 70 | Temp 98.7°F | Resp 20 | Ht 71.0 in | Wt 356.0 lb

## 2018-03-23 DIAGNOSIS — I5043 Acute on chronic combined systolic (congestive) and diastolic (congestive) heart failure: Secondary | ICD-10-CM | POA: Diagnosis not present

## 2018-03-23 DIAGNOSIS — I10 Essential (primary) hypertension: Secondary | ICD-10-CM | POA: Diagnosis not present

## 2018-03-23 DIAGNOSIS — I43 Cardiomyopathy in diseases classified elsewhere: Secondary | ICD-10-CM

## 2018-03-23 DIAGNOSIS — E782 Mixed hyperlipidemia: Secondary | ICD-10-CM

## 2018-03-23 DIAGNOSIS — I110 Hypertensive heart disease with heart failure: Secondary | ICD-10-CM

## 2018-03-23 DIAGNOSIS — E119 Type 2 diabetes mellitus without complications: Secondary | ICD-10-CM

## 2018-03-23 LAB — GLUCOSE, POCT (MANUAL RESULT ENTRY): POC Glucose: 154 mg/dL — AB (ref 70–99)

## 2018-03-23 LAB — POCT GLYCOSYLATED HEMOGLOBIN (HGB A1C): HbA1c, POC (controlled diabetic range): 6.6 % (ref 0.0–7.0)

## 2018-03-23 MED ORDER — ATORVASTATIN CALCIUM 20 MG PO TABS: 20.0000 mg | ORAL_TABLET | Freq: Every day | ORAL | 3 refills | Status: DC

## 2018-03-23 MED ORDER — CARVEDILOL 25 MG PO TABS: 25.0000 mg | ORAL_TABLET | Freq: Two times a day (BID) | ORAL | 0 refills | Status: DC

## 2018-03-23 MED ORDER — HYDROCHLOROTHIAZIDE 25 MG PO TABS: 25.0000 mg | ORAL_TABLET | Freq: Every day | ORAL | 3 refills | Status: DC

## 2018-03-23 MED ORDER — AMLODIPINE BESYLATE 10 MG PO TABS: 10.0000 mg | ORAL_TABLET | Freq: Every day | ORAL | 3 refills | Status: DC

## 2018-03-23 MED ORDER — LISINOPRIL 40 MG PO TABS: 40.0000 mg | ORAL_TABLET | Freq: Every day | ORAL | 3 refills | Status: DC

## 2018-03-23 MED FILL — ATORVASTATIN 20 MG TABLET: 20 | 30 days supply | Qty: 30 | Fill #0

## 2018-03-23 NOTE — Progress Notes (Signed)
Subjective:    Patient ID: Scott Livingston, male    DOB: 04-13-1971, 47 y.o.   MRN: [id number]  HPI       47 year old male new to me as a patient.  Patient was last  in the office on Aug 11, 2017 in follow-up of hypertension but patient left before being seen.  Patient was seen in February 2019 and patient had newly diagnosed diabetes in addition to hypertension and CHF.  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  Patient states that when he comes to his doctor's appointments, someone else has to fill in for him and he feels that this causes his clients to have a disruption in their regular routine.  Patient states that this is why he left his appointment in May after waiting for a while to be seen.      Patient reports that he is taking his medications for control of diabetes and hypertension.  Patient has not been taking his cholesterol medication on a regular basis.  Patient does not check his blood sugars on a regular basis as they are generally 120 or less fasting and occasionally up to 130-140 fasting depending on if he eats things that he knows that are not part of his diabetic diet.  Patient admits that he does not get any exercise on a regular basis and he believes that this is contributed to his continued weight gain.  Patient does have some issues with increased thirst and increased hunger.  Patient denies urinary frequency except for with use of 1 of his medications which causes more frequent urination.      Patient reports a history of coronary artery disease with prior heart cath.  Patient denies any recent chest pain.  Patient states that he was told when he lived back in [place] that he had a heart attack.  Patient continues to take a daily aspirin.  He does not recall the last time he saw a cardiologist for evaluation.  Patient also with a history of CHF but denies any issues  with shortness of breath but states that he is not very active and he denies any issues with peripheral edema.  Patient takes his blood pressure medicine daily and denies any headaches or dizziness related to his blood pressure. Past Medical History:  Diagnosis Date  . Acute CHF (congestive heart failure) (HCC)    [name] 06/05/2014  . Arthritis    "knees" (06/05/2014)  . Headache    "maybe monthly" (06/05/2014)  . Hypercholesteremia   . Hypertension   . Myocardial infarction (HCC) ~ 2010   "mild; I lived in [place]"  . Shortness of breath dyspnea   . Sleep apnea    "never received a mask" (06/05/2014)   Past Surgical History:  Procedure Laterality Date  . CARDIAC CATHETERIZATION N/A 10/18/2014   Procedure: Right/Left Heart Cath and Coronary Angiography;  Surgeon: Marykay Lex, MD;  Location: [facility] INVASIVE CV LAB;  Service: Cardiovascular;  Laterality: N/A;   Family History  Problem Relation Age of Onset  . Diabetes Father   . Congestive Heart Failure Father   . Hypertension Mother    Social History   Tobacco Use  . Smoking status: Current Every Day Smoker    Packs/day: 0.25    Years: 20.00    Pack years: 5.00    Types: Cigarettes  . Smokeless tobacco: Never Used  .  Tobacco comment: "stopped smoking in ~ 2010-2011"  Substance Use Topics  . Alcohol use: Yes    Comment: 06/05/2014 "might have a few drinks/month"  . Drug use: No  No Known Allergies    Review of Systems  Constitutional: Positive for fatigue. Negative for chills and fever.  HENT: Negative for sore throat and trouble swallowing.   Respiratory: Negative for cough and shortness of breath.   Cardiovascular: Positive for leg swelling (occasional). Negative for chest pain and palpitations.  Gastrointestinal: Negative for abdominal pain, constipation and diarrhea.  Endocrine: Positive for polydipsia and polyphagia. Negative for polyuria.  Genitourinary: Negative for dysuria and frequency.  Musculoskeletal: Positive for  arthralgias (recurrent knee pain) and back pain (occasional).  Neurological: Negative for dizziness and headaches.       Objective:   Physical Exam Cardiovascular:     Pulses:          Dorsalis pedis pulses are 1+ on the right side and 1+ on the left side.       Posterior tibial pulses are 1+ on the right side and 1+ on the left side.  Musculoskeletal:     Right foot: Normal range of motion. No deformity, bunion, Charcot foot or foot drop.     Left foot: Normal range of motion. No deformity, bunion, Charcot foot or foot drop.  Feet:     Right foot:     Protective Sensation: 10 sites tested. 10 sites sensed.     Skin integrity: Dry skin present. No ulcer, blister or skin breakdown.     Toenail Condition: Right toenails are normal.     Left foot:     Protective Sensation: 10 sites tested. 10 sites sensed.     Skin integrity: Dry skin present. No ulcer, blister or skin breakdown.     Toenail Condition: Left toenails are normal.    BP (!) 146/89 (BP Location: Left Arm, Patient Position: Sitting, Cuff Size: Large)   Pulse 70   Temp 98.7 F (37.1 C) (Oral)   Resp 20   Ht 5\' 11"  (1.803 m)   Wt (!) 356 lb (161.5 kg)   SpO2 95%   BMI 49.65 kg/m Nurse's notes and vital signs reviewed General- obese, larger framed male in no acute distress.  Patient with a mild speech impediment Neck-supple, large neck size, no lymphadenopathy, no JVD Lungs-clear to auscultation bilaterally Cardiovascular-regular rate and rhythm but heart sounds are distant secondary to body habitus Abdomen-truncal obesity, soft and nontender Back-no CVA tenderness Extremities-no edema Diabetic foot exam- see above, patient with normal monofilament exam, no active skin breakdown on the feet as well as 1+ dorsalis pedis and posterior tibial pulses bilaterally         Assessment & Plan:  1. Newly diagnosed diabetes (HCC) Diagnosis was added by CMA in order to check hemoglobin A1c and glucose as per clinic  protocol.  Patient with new diagnosis of type 2 diabetes in February 2019 when his hemoglobin A1c was elevated at 6.9.  Patient at that time was placed on Jardiance 25 mg daily and glipizide 10 mg before breakfast per office note. - HgB A1c - Glucose (CBG)  2. Essential hypertension Patient with history of hypertension but on review of chart, patient also has had past diagnosis of hypertensive cardiomyopathy, and CHF with acute presentation in February 2016 and ED visit 09/10/2015 for shortness of breath and blood pressure in the emergency department was 172/22.  Patient was found to have elevated BNP-brain natruretic peptide of  201.5 consistent with CHF exacerbation.  Patient also status post heart catheterization in July 2016 by local cardiologist, Dr. Herbie Baltimore, which did show pulmonary hypertension with normal coronary arteries.  Patient was provided with refills of amlodipine, carvedilol, lisinopril and hydrochlorothiazide.  Patient was offered cardiology referral which he initially declined but then agreed to have placed for further follow-up of his hypertension and history of CHF. - amLODipine (NORVASC) 10 MG tablet; Take 1 tablet (10 mg total) by mouth daily.  Dispense: 30 tablet; Refill: 3 - carvedilol (COREG) 25 MG tablet; Take 1 tablet (25 mg total) by mouth 2 (two) times daily.  Dispense: 60 tablet; Refill: 0 - lisinopril (PRINIVIL,ZESTRIL) 40 MG tablet; Take 1 tablet (40 mg total) by mouth daily.  Dispense: 30 tablet; Refill: 3 - hydrochlorothiazide (HYDRODIURIL) 25 MG tablet; Take 1 tablet (25 mg total) by mouth daily.  Dispense: 30 tablet; Refill: 3 - Ambulatory referral to Cardiology  3. Type 2 diabetes mellitus without complication, without long-term current use of insulin (HCC) Patient patient's hemoglobin A1c at today's visit was 6.6 showing good control of his blood sugars.  In addition to hemoglobin A1c, patient will also have CMP and lipid panel as patient reports that he is not  regularly taking his prescribed statin medication.  Patient is encouraged to follow a low carbohydrate diet.  Per past records, patient was referred to medical nutritionist but does not appear to have kept this appointment.  Patient is encouraged to participate in self-care by following a healthy diet along with instituting a regular exercise program to help with control of blood sugars weight loss and improvement in general health.  Patient was encouraged to have diabetic eye exam.  Urine microalbumin/creatinine ratio was to be ordered however patient was unable to give a urine sample sample while at today's visit. - Comprehensive metabolic panel - Lipid Panel  4. Systolic and diastolic CHF, acute on chronic [facility]) Patient has a history of combined CHF for which he is not had any regular medical follow-up.  Patient reports that he has been compliant with the medications that were prescribed for hypertension/CHF.  Patient did agree to be referred for further evaluation with cardiology - Ambulatory referral to Cardiology  5. Cardiomyopathy due to hypertension, with heart failure [facility]) Patient with history of hypertensive cardiomyopathy due to prior noncompliance with medication for the treatment of his hypertension.  Patient also with morbid obesity.  Patient agrees to referral to cardiology for further evaluation and patient will continue current medications for control of hypertension. - Ambulatory referral to Cardiology  6. Mixed hyperlipidemia Patient will have a lipid panel done at today's visit.  Patient reports past history of MI while living in [place] and patient with hypertension and diabetes.  Patient will be notified regarding statin recommendation based on today's lipid panel and patient was made aware that statin medication is recommended secondary to his history of MI though patient on review of chart did have repeat heart cath done in 2016 which apparently did not show any coronary  artery disease and statin recommended due to patient being diabetic. - Lipid Panel  *Influenza immunization offered at today's visit but was declined by the patient.  Patient was told of the importance of having influenza immunization especially as he works with special population adults and patient states that he will consider obtaining influenza immunization through his workplace.  An After Visit Summary was printed and given to the patient.  Return in about 3 months (around 06/22/2018) for  dm/htn.

## 2018-03-24 LAB — COMPREHENSIVE METABOLIC PANEL WITH GFR
ALT: 25 IU/L (ref 0–44)
AST: 15 IU/L (ref 0–40)
Albumin/Globulin Ratio: 1.8 (ref 1.2–2.2)
Albumin: 4.4 g/dL (ref 3.5–5.5)
Alkaline Phosphatase: 82 IU/L (ref 39–117)
BUN/Creatinine Ratio: 17 (ref 9–20)
BUN: 18 mg/dL (ref 6–24)
Bilirubin Total: 0.3 mg/dL (ref 0.0–1.2)
CO2: 20 mmol/L (ref 20–29)
Calcium: 9.4 mg/dL (ref 8.7–10.2)
Chloride: 100 mmol/L (ref 96–106)
Creatinine, Ser: 1.09 mg/dL (ref 0.76–1.27)
GFR calc Af Amer: 94 mL/min/1.73
GFR calc non Af Amer: 82 mL/min/1.73
Globulin, Total: 2.4 g/dL (ref 1.5–4.5)
Glucose: 139 mg/dL — ABNORMAL HIGH (ref 65–99)
Potassium: 4.4 mmol/L (ref 3.5–5.2)
Sodium: 139 mmol/L (ref 134–144)
Total Protein: 6.8 g/dL (ref 6.0–8.5)

## 2018-03-24 LAB — LIPID PANEL
Chol/HDL Ratio: 4.6 ratio (ref 0.0–5.0)
Cholesterol, Total: 192 mg/dL (ref 100–199)
HDL: 42 mg/dL
LDL Calculated: 113 mg/dL — ABNORMAL HIGH (ref 0–99)
Triglycerides: 185 mg/dL — ABNORMAL HIGH (ref 0–149)
VLDL Cholesterol Cal: 37 mg/dL (ref 5–40)

## 2018-03-25 MED FILL — CARVEDILOL 25 MG TABLET: 25 | 30 days supply | Qty: 60 | Fill #0

## 2018-03-25 MED FILL — HYDROCHLOROTHIAZIDE 25 MG T: 25 | 30 days supply | Qty: 30 | Fill #0

## 2018-03-25 MED FILL — AMLODIPINE BESYLATE 10 MG T: 10 | 30 days supply | Qty: 30 | Fill #0

## 2018-03-25 MED FILL — LISINOPRIL 40 MG TABLET: 40 | 30 days supply | Qty: 30 | Fill #0

## 2018-04-11 ENCOUNTER — Telehealth: Payer: Self-pay | Admitting: *Deleted

## 2018-04-11 ENCOUNTER — Other Ambulatory Visit: Payer: Self-pay | Admitting: Family Medicine

## 2018-04-11 DIAGNOSIS — E119 Type 2 diabetes mellitus without complications: Secondary | ICD-10-CM

## 2018-04-11 DIAGNOSIS — I10 Essential (primary) hypertension: Secondary | ICD-10-CM

## 2018-04-11 DIAGNOSIS — Z9189 Other specified personal risk factors, not elsewhere classified: Secondary | ICD-10-CM

## 2018-04-11 MED ORDER — GLIPIZIDE 10 MG PO TABS: 10.0000 mg | ORAL_TABLET | Freq: Every day | ORAL | 4 refills | Status: DC

## 2018-04-11 MED ORDER — ROSUVASTATIN CALCIUM 10 MG PO TABS: 10.0000 mg | ORAL_TABLET | Freq: Every day | ORAL | 4 refills | Status: DC

## 2018-04-11 NOTE — Telephone Encounter (Signed)
Can you contact patient and find out what he has been taking for his diabetes? I sent in an order for the glipizide and he needs to make sure that he eats soon after taking his medication. Since his Hgb A1c was only 6.6, he likely does not need both glipizide and Jardiance. Additionally, is there any reason that he is not on Metformin/glucophage? Ask if he has tried the medication and if so did he have any problems with the medication and if not then it is recommended as first line therapy. I sent in generic Crestor for his cholesterol as this tends to work better with fewer side effects. [facility] but if he wants them sent here then please give verbal order to pharmacy here to fill the medications and [facility]

## 2018-04-11 NOTE — Progress Notes (Signed)
Patient ID: Scott Livingston, male   DOB: 01/30/1971, 47 y.o.   MRN: [id number]   See phone encounter. Patient requesting refills of his cholesterol medication and DM medications after being notified of his lab results. Will order refills. Patient's Hgb A1c was 6.6. I will place an order for glucotrol but not sure if patient needs glucotrol and Jardiance and will have CMA find out why patient is not on metformin as first line therapy.

## 2018-04-11 NOTE — Telephone Encounter (Signed)
-----   Message from Cain Saupeammie Fulp, MD sent at 04/08/2018  5:54 PM EST ----- Glucose was 139 on recent blood work. Liver enzymes were normal. Continue use of cholesterol medication, atorvastatin as LDL/bad cholesterol was 113 and goal LDL is 70 or less since patient is diabetic

## 2018-04-11 NOTE — Telephone Encounter (Signed)
Patient verified DOB Patient is aware of needing to continue with DM medication and atorvastatin for cholesterol. Patient requested a refill on DM medication.

## 2018-04-12 ENCOUNTER — Encounter: Payer: Self-pay | Admitting: Family Medicine

## 2018-05-18 NOTE — Telephone Encounter (Signed)
MA attempted to reach patient and follow up on medication compliance and follow up.  Medical Assistant left message on patient's home and cell voicemail. Voicemail states to give a call back to [place] with [facility] at [phone].

## 2018-07-14 ENCOUNTER — Other Ambulatory Visit: Payer: Self-pay | Admitting: Family Medicine

## 2018-07-14 DIAGNOSIS — I10 Essential (primary) hypertension: Secondary | ICD-10-CM

## 2018-07-14 MED FILL — ATORVASTATIN 20 MG TABLET: 20 | 30 days supply | Qty: 30 | Fill #1

## 2018-07-14 MED FILL — HYDROCHLOROTHIAZIDE 25 MG T: 25 | 30 days supply | Qty: 30 | Fill #1

## 2018-07-14 MED FILL — AMLODIPINE BESYLATE 10 MG T: 10 | 30 days supply | Qty: 30 | Fill #1

## 2018-07-14 MED FILL — CARVEDILOL 25 MG TABLET: 25 | 30 days supply | Qty: 60 | Fill #0

## 2018-07-14 MED FILL — LISINOPRIL 40 MG TABLET: 40 | 30 days supply | Qty: 30 | Fill #1

## 2018-07-25 ENCOUNTER — Telehealth: Payer: Self-pay

## 2018-07-25 NOTE — Telephone Encounter (Signed)
Virtual Visit Pre-Appointment Phone Call  Steps For Call:  1. Confirm consent - "In the setting of the current Covid19 crisis, you are scheduled for a (phone or video) visit with your provider on (date) at (time).  Just as we do with many in-office visits, in order for you to participate in this visit, we must obtain consent.  If you'd like, I can send this to your mychart (if signed up) or email for you to review.  Otherwise, I can obtain your verbal consent now.  All virtual visits are billed to your insurance company just like a normal visit would be.  By agreeing to a virtual visit, we'd like you to understand that the technology does not allow for your provider to perform an examination, and thus may limit your provider's ability to fully assess your condition.  Finally, though the technology is pretty good, we cannot assure that it will always work on either your or our end, and in the setting of a video visit, we may have to convert it to a phone-only visit.  In either situation, we cannot ensure that we have a secure connection.  Are you willing to proceed?"  2. Give patient instructions for WebEx download to smartphone as below if video visit  3. Advise patient to be prepared with any vital sign or heart rhythm information, their current medicines, and a piece of paper and pen handy for any instructions they may receive the day of their visit  4. Inform patient they will receive a phone call 15 minutes prior to their appointment time (may be from unknown caller ID) so they should be prepared to answer  5. Confirm that appointment type is correct in Epic appointment notes (video vs telephone)    TELEPHONE CALL NOTE  Scott Livingston has been deemed a candidate for a follow-up tele-health visit to limit community exposure during the Covid-19 pandemic. I spoke with the patient via phone to ensure availability of phone/video source, confirm preferred email & phone number, and discuss  instructions and expectations.  I reminded Scott Livingston to be prepared with any vital sign and/or heart rhythm information that could potentially be obtained via home monitoring, at the time of his visit. I reminded Scott Livingston to expect a phone call at the time of his visit if his visit.  Did the patient verbally acknowledge consent to treatment? YES  Patient agrees to VIDEO Visit via DOXI.ME with Dr. Eldridge DaceVaranasi  Lattie HawBrittany I Orey Moure, RN 07/25/2018 4:47 PM   CONSENT FOR TELE-HEALTH VISIT - PLEASE REVIEW  I hereby voluntarily request, consent and authorize [facility] and its employed or contracted physicians, physician assistants, nurse practitioners or other licensed health care professionals (the Practitioner), to provide me with telemedicine health care services (the Services") as deemed necessary by the treating Practitioner. I acknowledge and consent to receive the Services by the Practitioner via telemedicine. I understand that the telemedicine visit will involve communicating with the Practitioner through live audiovisual communication technology and the disclosure of certain medical information by electronic transmission. I acknowledge that I have been given the opportunity to request an in-person assessment or other available alternative prior to the telemedicine visit and am voluntarily participating in the telemedicine visit.  I understand that I have the right to withhold or withdraw my consent to the use of telemedicine in the course of my care at any time, without affecting my right to future care or treatment, and that the Practitioner or I may terminate  the telemedicine visit at any time. I understand that I have the right to inspect all information obtained and/or recorded in the course of the telemedicine visit and may receive copies of available information for a reasonable fee.  I understand that some of the potential risks of receiving the Services via telemedicine include:    Delay or interruption in medical evaluation due to technological equipment failure or disruption;  Information transmitted may not be sufficient (e.g. poor resolution of images) to allow for appropriate medical decision making by the Practitioner; and/or   In rare instances, security protocols could fail, causing a breach of personal health information.  Furthermore, I acknowledge that it is my responsibility to provide information about my medical history, conditions and care that is complete and accurate to the best of my ability. I acknowledge that Practitioner's advice, recommendations, and/or decision may be based on factors not within their control, such as incomplete or inaccurate data provided by me or distortions of diagnostic images or specimens that may result from electronic transmissions. I understand that the practice of medicine is not an exact science and that Practitioner makes no warranties or guarantees regarding treatment outcomes. I acknowledge that I will receive a copy of this consent concurrently upon execution via email to the email address I last provided but may also request a printed copy by calling the office of [facility].    I understand that my insurance will be billed for this visit.   I have read or had this consent read to me.  I understand the contents of this consent, which adequately explains the benefits and risks of the Services being provided via telemedicine.   I have been provided ample opportunity to ask questions regarding this consent and the Services and have had my questions answered to my satisfaction.  I give my informed consent for the services to be provided through the use of telemedicine in my medical care  By participating in this telemedicine visit I agree to the above.

## 2018-07-29 ENCOUNTER — Other Ambulatory Visit: Payer: Self-pay

## 2018-07-29 ENCOUNTER — Encounter: Payer: Self-pay | Admitting: Interventional Cardiology

## 2018-07-29 ENCOUNTER — Encounter: Payer: PRIVATE HEALTH INSURANCE | Admitting: Interventional Cardiology

## 2018-08-09 NOTE — Progress Notes (Signed)
Virtual Visit via Video Note   This visit type was conducted due to national recommendations for restrictions regarding the COVID-19 Pandemic (e.g. social distancing) in an effort to limit this patient's exposure and mitigate transmission in our community.  Due to his co-morbid illnesses, this patient is at least at moderate risk for complications without adequate follow up.  This format is felt to be most appropriate for this patient at this time.  All issues noted in this document were discussed and addressed.  A limited physical exam was performed with this format.  Please refer to the patient's chart for his consent to telehealth [facility].   Evaluation Performed:  Follow-up visit  Date:  08/10/2018   ID:  Scott Livingston, DOB 05/06/1970, MRN [id number]  Patient Location: Home Provider Location: Home  PCP:  Cain SaupeFulp, Cammie, MD  Cardiologist:  Tobias AlexanderKatarina Nelson, MD  Electrophysiologist:  None   Chief Complaint:  F/U  History of Present Illness:    Scott Bundenthony Watling is a 48 y.o. 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  Last seen by Dr. Delton SeeNelson 12/2014.  Denies chest pain, dyspnea, dyspnea on exertion, dizziness or presyncope. 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 Complains of increased urination in the past month. Urinating every hour rather than every 2-3 hrs. No burning or pain or change in meds. Lost 20 lbs since Sept. Smoking again-7-8 cigarettes/day.   The patient does not have symptoms concerning for COVID-19 infection (fever, chills, cough, or new shortness of breath).    Past Medical History:  Diagnosis Date  . Acute CHF (congestive heart failure) (HCC)    [name]/notes 06/05/2014  . Arthritis    "knees" (06/05/2014)  . Headache    "maybe monthly" (06/05/2014)  . Hypercholesteremia   .  Hypertension   . Myocardial infarction (HCC) ~ 2010   "mild; I lived in [place]"  . Shortness of breath dyspnea   . Sleep apnea    "never received a mask" (06/05/2014)   Past Surgical History:  Procedure Laterality Date  . CARDIAC CATHETERIZATION N/A 10/18/2014   Procedure: Right/Left Heart Cath and Coronary Angiography;  Surgeon: Marykay Lexavid W Harding, MD;  [facility];  Service: Cardiovascular;  Laterality: N/A;     Current Meds  Medication Sig  . amLODipine (NORVASC) 10 MG tablet Take 1 tablet (10 mg total) by mouth daily.  Marland Kitchen. atorvastatin (LIPITOR) 20 MG tablet Take 1 tablet by mouth daily.  . carvedilol (COREG) 25 MG tablet TAKE 1 TABLET (25 MG TOTAL) BY MOUTH 2 (TWO) TIMES DAILY.  . hydrochlorothiazide (HYDRODIURIL) 25 MG tablet Take 1 tablet (25 mg total) by mouth daily.  Marland Kitchen. lisinopril (PRINIVIL,ZESTRIL) 40 MG tablet Take 1 tablet (40 mg total) by mouth daily.     Allergies:   Patient has no known allergies.   Social History   Tobacco Use  . Smoking status: Current Every Day Smoker    Packs/day: 0.25    Years: 20.00    Pack years: 5.00    Types: Cigarettes  . Smokeless tobacco: Never Used  . Tobacco comment: "stopped smoking in ~ 2010-2011"  Substance Use Topics  . Alcohol use: Yes    Comment: 06/05/2014 "might have a few drinks/month"  . Drug use: No     Family Hx: The patient's family history  includes Congestive Heart Failure in his father; Diabetes in his father; Hypertension in his mother.  ROS:   Please see the history of present illness.    Review of Systems  Constitution: Negative.  HENT: Negative.   Cardiovascular: Negative.   Respiratory: Negative.   Endocrine: Negative.   Hematologic/Lymphatic: Negative.   Musculoskeletal: Negative.   Gastrointestinal: Negative.   Genitourinary: Positive for frequency.  Neurological: Negative.    All other systems reviewed and are negative.   Prior CV studies:   The following studies were reviewed today:   R/L heart cath 10/2014 1. The left ventricular systolic function is normal by LV Gram with normal LVEDP that is lower than PCWP. 2. Angiographically normal coroanry arteries. 3. Mild Pulmonary HTN with mildly elevated PCWP, but normal LVEDP.   Angiographic normal coronary arteries with normal LV function by LV gram. EF appears to be much better than previously reported on echo. Only minimally elevated right heart pressures with difficulty explaining elevated wedge pressure versus LV EDP. No evidence of MR on LV gram. Suggest possible mild pulmonary venous congestion. With his obesity he is in the process of undergoing evaluation for OSA.   Recommendations:  Standard post radial and brachial cath sheath removal/care.  Continue home medications for aggressive blood pressure treatment. Would not further increase diuretic as he appears to be relatively well diuresed.  Pursue evaluation for OSA and treat accordingly.     Patient we discharged from the holding area to follow-up with Dr. Delton See.          Echocardiogram - 06/05/2014 Left ventricle: The cavity size was normal. Wall thickness was   normal. Systolic function was mildly to moderately reduced. The   estimated ejection fraction was in the range of 40% to 45%.   Diffuse hypokinesis. Doppler parameters are consistent with a   reversible restrictive pattern, indicative of decreased left   ventricular diastolic compliance and/or increased left atrial   pressure (grade 3 diastolic dysfunction). - Mitral valve: There was mild regurgitation. - Left atrium: The atrium was moderately dilated.   ECG - SR, LVH with repolarization abnormalities       Labs/Other Tests and Data Reviewed:    EKG:  No ECG reviewed.  Recent Labs: 12/19/2017: Hemoglobin 14.3; Platelets 199 03/23/2018: ALT 25; BUN 18; Creatinine, Ser 1.09; Potassium 4.4; Sodium 139   Recent Lipid Panel Lab Results  Component Value Date/Time   CHOL 192 03/23/2018 12:16  PM   TRIG 185 (H) 03/23/2018 12:16 PM   HDL 42 03/23/2018 12:16 PM   CHOLHDL 4.6 03/23/2018 12:16 PM   CHOLHDL 3.6 06/08/2016 10:44 AM   LDLCALC 113 (H) 03/23/2018 12:16 PM    Wt Readings from Last 3 Encounters:  08/10/18 (!) 340 lb (154.2 kg)  03/23/18 (!) 356 lb (161.5 kg)  12/19/17 (!) 360 lb (163.3 kg)     Objective:    Vital Signs:  BP (!) 142/79   Pulse 84   Ht  (1.803 m)   Wt (!) 340 lb (154.2 kg)   BMI 47.42 kg/m    VITAL SIGNS:  reviewed GEN:  no acute distress RESPIRATORY:  normal respiratory effort, symmetric expansion CARDIOVASCULAR:  no peripheral edema  ASSESSMENT & PLAN:    1. Hypertensive Cardiomyopathy-will order an echo for August or Sept.  2. Chronic combined systolic and diastolic CHF-compensated. Increase urinary frequency-asked him to f/u with community health. 3. Essential HTN-BP well controlled. 4. Morbid Obesity- working on weight loss-increase exercise to 150 min/week.  5. Tobacco abuse-had quit in the past but restarted-says he's trying to quit-offered nicotine patches but he declined.  COVID-19 Education: The signs and symptoms of COVID-19 were discussed with the patient and how to seek care for testing (follow up with PCP or arrange E-visit).   The importance of social distancing was discussed today.  Time:   Today, I have spent 12 minutes with the patient with telehealth technology discussing the above problems.     Medication Adjustments/Labs and Tests Ordered: Current medicines are reviewed at length with the patient today.  Concerns regarding medicines are outlined above.   Tests Ordered: No orders of the defined types were placed in this encounter.   Medication Changes: No orders of the defined types were placed in this encounter.   Disposition:  Follow up in 1 year(s) Dr. Delton See  Signed, Jacolyn Reedy, PA-C  08/10/2018 8:52 AM    [facility]

## 2018-08-10 ENCOUNTER — Telehealth (INDEPENDENT_AMBULATORY_CARE_PROVIDER_SITE_OTHER): Payer: PRIVATE HEALTH INSURANCE | Admitting: Physician Assistant

## 2018-08-10 ENCOUNTER — Encounter: Payer: Self-pay | Admitting: Physician Assistant

## 2018-08-10 ENCOUNTER — Other Ambulatory Visit: Payer: Self-pay

## 2018-08-10 VITALS — BP 142/79 | HR 84 | Ht 71.0 in | Wt 340.0 lb

## 2018-08-10 DIAGNOSIS — I43 Cardiomyopathy in diseases classified elsewhere: Secondary | ICD-10-CM | POA: Diagnosis not present

## 2018-08-10 DIAGNOSIS — I5042 Chronic combined systolic (congestive) and diastolic (congestive) heart failure: Secondary | ICD-10-CM

## 2018-08-10 DIAGNOSIS — I110 Hypertensive heart disease with heart failure: Secondary | ICD-10-CM

## 2018-08-10 DIAGNOSIS — I10 Essential (primary) hypertension: Secondary | ICD-10-CM

## 2018-08-10 DIAGNOSIS — Z6841 Body Mass Index (BMI) 40.0 and over, adult: Secondary | ICD-10-CM

## 2018-08-10 MED ORDER — AMLODIPINE BESYLATE 10 MG PO TABS: 10.0000 mg | ORAL_TABLET | Freq: Every day | ORAL | 3 refills | Status: AC

## 2018-08-10 MED ORDER — ATORVASTATIN CALCIUM 20 MG PO TABS: 20.0000 mg | ORAL_TABLET | Freq: Every day | ORAL | 3 refills | Status: AC

## 2018-08-10 MED ORDER — LISINOPRIL 40 MG PO TABS: 40.0000 mg | ORAL_TABLET | Freq: Every day | ORAL | 3 refills | Status: AC

## 2018-08-10 MED ORDER — CARVEDILOL 25 MG PO TABS: 25.0000 mg | ORAL_TABLET | Freq: Two times a day (BID) | ORAL | 3 refills | Status: AC

## 2018-08-10 MED ORDER — HYDROCHLOROTHIAZIDE 25 MG PO TABS: 25.0000 mg | ORAL_TABLET | Freq: Every day | ORAL | 3 refills | Status: AC

## 2018-08-10 MED FILL — CARVEDILOL 25 MG TABLET: 25 | 30 days supply | Qty: 60 | Fill #0

## 2018-08-10 MED FILL — HYDROCHLOROTHIAZIDE 25 MG T: 25 | 30 days supply | Qty: 30 | Fill #0

## 2018-08-10 MED FILL — ATORVASTATIN 20 MG TABLET: 20 | 30 days supply | Qty: 30 | Fill #0

## 2018-08-10 MED FILL — AMLODIPINE BESYLATE 10 MG T: 10 | 30 days supply | Qty: 30 | Fill #0

## 2018-08-10 MED FILL — LISINOPRIL 40 MG TABLET: 40 | 30 days supply | Qty: 30 | Fill #0

## 2018-08-10 NOTE — Patient Instructions (Addendum)
Medication Instructions:  Your physician recommends that you continue on your current medications as directed. Please refer to the Current Medication list given to you today.  If you need a refill on your cardiac medications before your next appointment, please call your pharmacy.   Lab work: None Ordered  If you have labs (blood work) drawn today and your tests are completely normal, you will receive your results only by: Marland Kitchen MyChart Message (if you have MyChart) OR . A paper copy in the mail If you have any lab test that is abnormal or we need to change your treatment, we will call you to review the results.  Testing/Procedures: Your physician has requested that you have an echocardiogram on 11/23/18 at 8:30 AM. Please arrive at 8:00 AM. Echocardiography is a painless test that uses sound waves to create images of your heart. It provides your doctor with information about the size and shape of your heart and how well your heart's chambers and valves are working. This procedure takes approximately one hour. There are no restrictions for this procedure.  Follow-Up: At [facility], you and your health needs are our priority.  As part of our continuing mission to provide you with exceptional heart care, we have created designated Provider Care Teams.  These Care Teams include your primary Cardiologist (physician) and Advanced Practice Providers (APPs -  Physician Assistants and Nurse Practitioners) who all work together to provide you with the care you need, when you need it. . You will need a follow up appointment in 1 year.  Please call our office 2 months in advance to schedule this appointment.  You may see Tobias Alexander, MD or one of the following Advanced Practice Providers on your designated Care Team:   . Robbie Lis, PA-C . Dayna Dunn, PA-C . Jacolyn Reedy, PA-C  Any Other Special Instructions Will Be Listed Below (If Applicable).  Your provider recommends that you maintain  150 minutes per week of moderate aerobic activity.   Steps to Quit Smoking  Smoking tobacco can be bad for your health. It can also affect almost every organ in your body. Smoking puts you and people around you at risk for many serious long-lasting (chronic) diseases. Quitting smoking is hard, but it is one of the best things that you can do for your health. It is never too late to quit. What are the benefits of quitting smoking? When you quit smoking, you lower your risk for getting serious diseases and conditions. They can include:  Lung cancer or lung disease.  Heart disease.  Stroke.  Heart attack.  Not being able to have children (infertility).  Weak bones (osteoporosis) and broken bones (fractures). If you have coughing, wheezing, and shortness of breath, those symptoms may get better when you quit. You may also get sick less often. If you are pregnant, quitting smoking can help to lower your chances of having a baby of low birth weight. What can I do to help me quit smoking? Talk with your doctor about what can help you quit smoking. Some things you can do (strategies) include:  Quitting smoking totally, instead of slowly cutting back how much you smoke over a period of time.  Going to in-person counseling. You are more likely to quit if you go to many counseling sessions.  Using resources and support systems, such as: ? Agricultural engineer with a Veterinary surgeon. ? Phone quitlines. ? Automotive engineer. ? Support groups or group counseling. ? Text messaging programs. ? Mobile  phone apps or applications.  Taking medicines. Some of these medicines may have nicotine in them. If you are pregnant or breastfeeding, do not take any medicines to quit smoking unless your doctor says it is okay. Talk with your doctor about counseling or other things that can help you. Talk with your doctor about using more than one strategy at the same time, such as taking medicines while you are also  going to in-person counseling. This can help make quitting easier. What things can I do to make it easier to quit? Quitting smoking might feel very hard at first, but there is a lot that you can do to make it easier. Take these steps:  Talk to your family and friends. Ask them to support and encourage you.  Call phone quitlines, reach out to support groups, or work with a Veterinary surgeoncounselor.  Ask people who smoke to not smoke around you.  Avoid places that make you want (trigger) to smoke, such as: ? Bars. ? Parties. ? Smoke-break areas at work.  Spend time with people who do not smoke.  Lower the stress in your life. Stress can make you want to smoke. Try these things to help your stress: ? Getting regular exercise. ? Deep-breathing exercises. ? Yoga. ? Meditating. ? Doing a body scan. To do this, close your eyes, focus on one area of your body at a time from head to toe, and notice which parts of your body are tense. Try to relax the muscles in those areas.  Download or buy apps on your mobile phone or tablet that can help you stick to your quit plan. There are many free apps, such as QuitGuide from the [employer] developer(Centers for Disease Control and Prevention). You can find more support from smokefree.gov and other websites. This information is not intended to replace advice given to you by your health care provider. Make sure you discuss any questions you have with your health care provider. Document Released: 01/24/2009 Document Revised: 11/26/2015 Document Reviewed: 08/14/2014 Elsevier Interactive Patient Education  2019 [employer]

## 2018-08-17 ENCOUNTER — Other Ambulatory Visit: Payer: Self-pay

## 2018-08-17 ENCOUNTER — Encounter (HOSPITAL_COMMUNITY): Payer: Self-pay

## 2018-08-17 ENCOUNTER — Emergency Department (HOSPITAL_COMMUNITY)
Admission: EM | Admit: 2018-08-17 | Discharge: 2018-08-17 | Disposition: A | Discharge status: Home / Self Care | Payer: PRIVATE HEALTH INSURANCE | Attending: Emergency Medicine | Admitting: Emergency Medicine

## 2018-08-17 DIAGNOSIS — E1165 Type 2 diabetes mellitus with hyperglycemia: Secondary | ICD-10-CM | POA: Diagnosis not present

## 2018-08-17 DIAGNOSIS — I110 Hypertensive heart disease with heart failure: Secondary | ICD-10-CM | POA: Diagnosis not present

## 2018-08-17 DIAGNOSIS — I252 Old myocardial infarction: Secondary | ICD-10-CM | POA: Diagnosis not present

## 2018-08-17 DIAGNOSIS — Z79899 Other long term (current) drug therapy: Secondary | ICD-10-CM | POA: Insufficient documentation

## 2018-08-17 DIAGNOSIS — I5042 Chronic combined systolic (congestive) and diastolic (congestive) heart failure: Secondary | ICD-10-CM | POA: Diagnosis not present

## 2018-08-17 DIAGNOSIS — F17210 Nicotine dependence, cigarettes, uncomplicated: Secondary | ICD-10-CM | POA: Diagnosis not present

## 2018-08-17 DIAGNOSIS — R739 Hyperglycemia, unspecified: Secondary | ICD-10-CM

## 2018-08-17 LAB — CBC WITH DIFFERENTIAL/PLATELET
Abs Immature Granulocytes: 0.01 10*3/uL (ref 0.00–0.07)
Basophils Absolute: 0 10*3/uL (ref 0.0–0.1)
Basophils Relative: 1 %
Eosinophils Absolute: 0.2 10*3/uL (ref 0.0–0.5)
Eosinophils Relative: 3 %
HCT: 48.2 % (ref 39.0–52.0)
Hemoglobin: 16.1 g/dL (ref 13.0–17.0)
Immature Granulocytes: 0 %
Lymphocytes Relative: 33 %
Lymphs Abs: 2.1 10*3/uL (ref 0.7–4.0)
MCH: 28.2 pg (ref 26.0–34.0)
MCHC: 33.4 g/dL (ref 30.0–36.0)
MCV: 84.4 fL (ref 80.0–100.0)
Monocytes Absolute: 0.6 10*3/uL (ref 0.1–1.0)
Monocytes Relative: 9 %
Neutro Abs: 3.5 10*3/uL (ref 1.7–7.7)
Neutrophils Relative %: 54 %
Platelets: 184 10*3/uL (ref 150–400)
RBC: 5.71 MIL/uL (ref 4.22–5.81)
RDW: 13.3 % (ref 11.5–15.5)
WBC: 6.4 10*3/uL (ref 4.0–10.5)
nRBC: 0 % (ref 0.0–0.2)

## 2018-08-17 LAB — URINALYSIS, ROUTINE W REFLEX MICROSCOPIC
Bacteria, UA: NONE SEEN
Bilirubin Urine: NEGATIVE
Glucose, UA: >=500 mg/dL — AB
Hgb urine dipstick: NEGATIVE
Ketones, ur: 20 mg/dL — AB
Leukocytes,Ua: NEGATIVE
Nitrite: NEGATIVE
Protein, ur: NEGATIVE mg/dL
Specific Gravity, Urine: 1.035 — ABNORMAL HIGH (ref 1.005–1.030)
pH: 6 (ref 5.0–8.0)

## 2018-08-17 LAB — CBG MONITORING, ED
Glucose-Capillary: 454 mg/dL — ABNORMAL HIGH (ref 70–99)
Glucose-Capillary: 477 mg/dL — ABNORMAL HIGH (ref 70–99)
Glucose-Capillary: 446 mg/dL — ABNORMAL HIGH (ref 70–99)
Glucose-Capillary: 495 mg/dL — ABNORMAL HIGH (ref 70–99)
Glucose-Capillary: 369 mg/dL — ABNORMAL HIGH (ref 70–99)

## 2018-08-17 LAB — COMPREHENSIVE METABOLIC PANEL
ALT: 45 U/L — ABNORMAL HIGH (ref 0–44)
AST: 37 U/L (ref 15–41)
Albumin: 3.7 g/dL (ref 3.5–5.0)
Alkaline Phosphatase: 107 U/L (ref 38–126)
Anion gap: 11 (ref 5–15)
BUN: 16 mg/dL (ref 6–20)
CO2: 24 mmol/L (ref 22–32)
Calcium: 9.2 mg/dL (ref 8.9–10.3)
Chloride: 96 mmol/L — ABNORMAL LOW (ref 98–111)
Creatinine, Ser: 1.14 mg/dL (ref 0.61–1.24)
GFR calc Af Amer: >60 mL/min (ref >60)
GFR calc non Af Amer: >60 mL/min (ref >60)
Glucose, Bld: 481 mg/dL — ABNORMAL HIGH (ref 70–99)
Potassium: 4.8 mmol/L (ref 3.5–5.1)
Sodium: 131 mmol/L — ABNORMAL LOW (ref 135–145)
Total Bilirubin: 1.4 mg/dL — ABNORMAL HIGH (ref 0.3–1.2)
Total Protein: 7 g/dL (ref 6.5–8.1)

## 2018-08-17 MED ORDER — SODIUM CHLORIDE 0.9 % IV BOLUS
500.0000 mL | Freq: Once | INTRAVENOUS | Status: AC
  Administered 2018-08-17: 09:00:00 500 mL via INTRAVENOUS

## 2018-08-17 MED ORDER — METFORMIN HCL 500 MG PO TABS: 500.0000 mg | ORAL_TABLET | Freq: Two times a day (BID) | ORAL | 0 refills | Status: AC

## 2018-08-17 MED ORDER — DEXTROSE-NACL 5-0.45 % IV SOLN: INTRAVENOUS | Status: DC

## 2018-08-17 MED ORDER — INSULIN ASPART 100 UNIT/ML ~~LOC~~ SOLN
5.0000 [IU] | Freq: Once | SUBCUTANEOUS | Status: AC
  Administered 2018-08-17: 10:00:00 5 [IU] via SUBCUTANEOUS

## 2018-08-17 MED ORDER — INSULIN ASPART 100 UNIT/ML ~~LOC~~ SOLN
8.0000 [IU] | Freq: Once | SUBCUTANEOUS | Status: AC
  Administered 2018-08-17: 8 [IU] via SUBCUTANEOUS

## 2018-08-17 MED ORDER — INSULIN ASPART 100 UNIT/ML ~~LOC~~ SOLN: 5.0000 [IU] | Freq: Once | SUBCUTANEOUS | Status: DC

## 2018-08-17 MED ORDER — INSULIN REGULAR(HUMAN) IN NACL 100-0.9 UT/100ML-% IV SOLN: INTRAVENOUS | Status: DC

## 2018-08-17 MED ORDER — SODIUM CHLORIDE 0.9 % IV BOLUS
1000.0000 mL | Freq: Once | INTRAVENOUS | Status: AC
  Administered 2018-08-17: 1000 mL via INTRAVENOUS

## 2018-08-17 NOTE — ED Triage Notes (Signed)
Per pt: He was "not feeling well" last night and his wife checked his blood sugar and said "It was in the 500's and this morning". Pt denies any other symptoms than "not feeling well". Pt states that he had "my blood checked in January and they said I might have diabetes".

## 2018-08-17 NOTE — Discharge Instructions (Addendum)
It is important you drink water.  Avoid sugary juices and drinks as this can have a large effect on raising your blood sugar. Limit your carbs and sugary food intake. Begin taking the metformin as prescribed. Follow-up closely with your primary care provider to discuss your new medications and visit today.

## 2018-08-17 NOTE — ED Notes (Signed)
Pt CBG was 495, notified Megan(RN) & Jordan(PA)

## 2018-08-17 NOTE — ED Notes (Signed)
Pt CBG was 446, notified Megan(RN)

## 2018-08-17 NOTE — ED Notes (Signed)
Pt given water 

## 2018-08-17 NOTE — ED Provider Notes (Signed)
[facility] EMERGENCY DEPARTMENT Provider Note   CSN: [id number] Arrival date & time: 08/17/18  0857    History   Chief Complaint Chief Complaint  Patient presents with  . Hyperglycemia    HPI Scott Livingston is a 48 y.o. male presenting for evaluation of hyperglycemia.  Patient states he has been feeling poorly since last night.  He describes it as a lightheaded feeling which is present when he stands up.  He states he has been having urinary frequency for the past 3 to 4 weeks, and been feeling very thirsty for the past 2 weeks.  Due to his frequent urination, he has been drinking lots of Gatorade.  He denies fevers, chills, diaphoresis, chest pain, shortness of breath, cough, nausea, vomiting, abdominal pain, abnormal bowel movements.  He denies dysuria or hematuria.  He states he was told that he may have diabetes in December, started on glipizide, however his PCP left the practice and he was switched to a new PCP.  Since then, he was taken off the diabetes medication, and has not been restarted.  He is does not check his blood sugars normally, but since feeling poorly last night he was checking them and it is blood sugars in the 500s.  He denies other medication changes recently.  Patient denies feeling lightheaded right now.  No complaints at this time.  Additional history obtained from chart review.  Patient with a history of CHF, hypertension, cardiomyopathy.     HPI  Past Medical History:  Diagnosis Date  . Acute CHF (congestive heart failure) (HCC)    [name] 06/05/2014  . Arthritis    "knees" (06/05/2014)  . Headache    "maybe monthly" (06/05/2014)  . Hypercholesteremia   . Hypertension   . Myocardial infarction (HCC) ~ 2010   "mild; I lived in [place]"  . Shortness of breath dyspnea   . Sleep apnea    "never received a mask" (06/05/2014)    Patient Active Problem List   Diagnosis Date Noted  . Hypertension 06/08/2016  . Bilateral knee pain 02/06/2015  .  Sleep apnea 01/21/2015  . Morbid obesity with BMI of 45.0-49.9, adult (HCC) 10/31/2014  . Acute combined systolic and diastolic congestive heart failure (HCC)   . Exertional chest pain 10/17/2014  . Systolic and diastolic CHF, acute on chronic (HCC) 10/11/2014  . Hypertensive cardiomyopathy (HCC)   . Demand ischemia of myocardium (HCC)   . Hypertensive urgency 06/04/2014  . Acute CHF (HCC) 06/04/2014  . Obesity 06/04/2014  . DOE (dyspnea on exertion) 06/04/2014  . Lower extremity edema 06/04/2014  . Hypokalemia 06/04/2014  . Elevated troponin 06/04/2014    Past Surgical History:  Procedure Laterality Date  . CARDIAC CATHETERIZATION N/A 10/18/2014   Procedure: Right/Left Heart Cath and Coronary Angiography;  Surgeon: Marykay Lex, MD;  Location: [facility] INVASIVE CV LAB;  Service: Cardiovascular;  Laterality: N/A;        Home Medications    Prior to Admission medications   Medication Sig Start Date End Date Taking? Authorizing Provider  amLODipine (NORVASC) 10 MG tablet Take 1 tablet (10 mg total) by mouth daily. 08/10/18   Dyann Kief, PA-C  atorvastatin (LIPITOR) 20 MG tablet Take 1 tablet (20 mg total) by mouth daily. 08/10/18   Dyann Kief, PA-C  carvedilol (COREG) 25 MG tablet Take 1 tablet (25 mg total) by mouth 2 (two) times daily. 08/10/18   Dyann Kief, PA-C  hydrochlorothiazide (HYDRODIURIL) 25 MG tablet Take 1  tablet (25 mg total) by mouth daily. 08/10/18   Dyann Kief, PA-C  lisinopril (ZESTRIL) 40 MG tablet Take 1 tablet (40 mg total) by mouth daily. 08/10/18   Dyann Kief, PA-C    Family History Family History  Problem Relation Age of Onset  . Diabetes Father   . Congestive Heart Failure Father   . Hypertension Mother     Social History Social History   Tobacco Use  . Smoking status: Current Every Day Smoker    Packs/day: 0.25    Years: 20.00    Pack years: 5.00    Types: Cigarettes  . Smokeless tobacco: Never Used  . Tobacco comment:  "stopped smoking in ~ 2010-2011"  Substance Use Topics  . Alcohol use: Yes    Comment: 06/05/2014 "might have a few drinks/month"  . Drug use: No     Allergies   Patient has no known allergies.   Review of Systems Review of Systems  Endocrine: Positive for polydipsia and polyuria.  Neurological: Positive for light-headedness.  All other systems reviewed and are negative.    Physical Exam Updated Vital Signs BP (!) 142/95 (BP Location: Right Arm)   Pulse 87   Temp 98.8 F (37.1 C) (Oral)   Resp 16   SpO2 96%   Physical Exam Vitals signs and nursing note reviewed.  Constitutional:      General: He is not in acute distress.    Appearance: He is well-developed.     Comments: Appears nontoxic  HENT:     Head: Normocephalic and atraumatic.  Eyes:     Extraocular Movements: Extraocular movements intact.     Conjunctiva/sclera: Conjunctivae normal.     Pupils: Pupils are equal, round, and reactive to light.  Neck:     Musculoskeletal: Normal range of motion and neck supple.  Cardiovascular:     Rate and Rhythm: Normal rate and regular rhythm.  Pulmonary:     Effort: Pulmonary effort is normal. No respiratory distress.     Breath sounds: Normal breath sounds. No wheezing.  Abdominal:     General: There is no distension.     Palpations: Abdomen is soft. There is no mass.     Tenderness: There is no abdominal tenderness. There is no guarding or rebound.  Musculoskeletal: Normal range of motion.  Skin:    General: Skin is warm and dry.     Capillary Refill: Capillary refill takes less than 2 seconds.  Neurological:     Mental Status: He is alert and oriented to person, place, and time.    ED Treatments / Results  Labs (all labs ordered are listed, but only abnormal results are displayed) Labs Reviewed  CBG MONITORING, ED - Abnormal; Notable for the following components:      Result Value   Glucose-Capillary 454 (*)    All other components within normal limits   CBC WITH DIFFERENTIAL/PLATELET  CBC  URINALYSIS, ROUTINE W REFLEX MICROSCOPIC  COMPREHENSIVE METABOLIC PANEL    EKG None  Radiology No results found.  Procedures Procedures (including critical care time)  Medications Ordered in ED Medications  sodium chloride 0.9 % bolus 500 mL (500 mLs Intravenous New Bag/Given 08/17/18 1610)     Initial Impression / Assessment and Plan / ED Course  I have reviewed the triage vital signs and the nursing notes.  Pertinent labs & imaging results that were available during my care of the patient were reviewed by me and considered in my medical decision making (  see chart for details).        Pt presenting for evaluation of feeling poorly and hyperglycemia.  Physical exam reassuring, he appears nontoxic.  Initial BGL's in the upper 400s. Will order labs and ua to r/o dka.  Gentle fluids started due to history of CHF.  Labs reassuring, no signs of DKA.  UA pending.  Potassium is stable, will start patient on insulin.  Had lengthy discussion with patient regarding diet and carb counting, encouraging him to stop drinking Gatorade and fruit juices.  Patient signed out to J. Roxan Hockeyobinson for further management.  Plan to continue to trend CBG, possibly needing further doses of insulin.  Plan to discharge patient with metformin and close follow-up with PCP.   Final Clinical Impressions(s) / ED Diagnoses   Final diagnoses:  None    ED Discharge Orders    None       Alveria ApleyCaccavale, Mckoy Bhakta, PA-C 08/17/18 1124    Little, Ambrose Finlandachel Morgan, MD 08/17/18 1214

## 2018-08-17 NOTE — ED Notes (Signed)
Sophia PA at bedside.  

## 2018-08-17 NOTE — ED Provider Notes (Signed)
Pt transferred to CDU pending recheck CBG and disposition. See PA Caccavale's note for full HPI and workup. Briefly, pt with hx of T2DM, presenting today with hyperglycemia, not in DKA. Pt hydrated and givein 5u novolog. Plan for recheck CBG for improvement, then discharge with metformin.  Physical Exam  BP 124/85   Pulse 77   Temp 98.8 F (37.1 C) (Oral)   Resp (!) 22   SpO2 96%   Physical Exam Vitals signs and nursing note reviewed.  Constitutional:      General: He is not in acute distress.    Appearance: He is well-developed.     Comments: Morbidly obese  HENT:     Head: Normocephalic and atraumatic.  Eyes:     Conjunctiva/sclera: Conjunctivae normal.  Cardiovascular:     Rate and Rhythm: Normal rate.  Pulmonary:     Effort: Pulmonary effort is normal.  Abdominal:     Palpations: Abdomen is soft.  Skin:    General: Skin is warm.  Neurological:     Mental Status: He is alert.  Psychiatric:        Behavior: Behavior normal.    Results for orders placed or performed during the hospital encounter of 08/17/18  Urinalysis, Routine w reflex microscopic  Result Value Ref Range   Color, Urine YELLOW YELLOW   APPearance CLEAR CLEAR   Specific Gravity, Urine 1.035 (H) 1.005 - 1.030   pH 6.0 5.0 - 8.0   Glucose, UA >=500 (A) NEGATIVE mg/dL   Hgb urine dipstick NEGATIVE NEGATIVE   Bilirubin Urine NEGATIVE NEGATIVE   Ketones, ur 20 (A) NEGATIVE mg/dL   Protein, ur NEGATIVE NEGATIVE mg/dL   Nitrite NEGATIVE NEGATIVE   Leukocytes,Ua NEGATIVE NEGATIVE   RBC / HPF 0-5 0 - 5 RBC/hpf   WBC, UA 0-5 0 - 5 WBC/hpf   Bacteria, UA NONE SEEN NONE SEEN  CBC with Differential  Result Value Ref Range   WBC 6.4 4.0 - 10.5 K/uL   RBC 5.71 4.22 - 5.81 MIL/uL   Hemoglobin 16.1 13.0 - 17.0 g/dL   HCT 16.148.2 09.639.0 - 04.552.0 %   MCV 84.4 80.0 - 100.0 fL   MCH 28.2 26.0 - 34.0 pg   MCHC 33.4 30.0 - 36.0 g/dL   RDW 40.913.3 81.111.5 - 91.415.5 %   Platelets 184 150 - 400 K/uL   nRBC 0.0 0.0 - 0.2 %   Neutrophils Relative % 54 %   Neutro Abs 3.5 1.7 - 7.7 K/uL   Lymphocytes Relative 33 %   Lymphs Abs 2.1 0.7 - 4.0 K/uL   Monocytes Relative 9 %   Monocytes Absolute 0.6 0.1 - 1.0 K/uL   Eosinophils Relative 3 %   Eosinophils Absolute 0.2 0.0 - 0.5 K/uL   Basophils Relative 1 %   Basophils Absolute 0.0 0.0 - 0.1 K/uL   Immature Granulocytes 0 %   Abs Immature Granulocytes 0.01 0.00 - 0.07 K/uL  Comprehensive metabolic panel  Result Value Ref Range   Sodium 131 (L) 135 - 145 mmol/L   Potassium 4.8 3.5 - 5.1 mmol/L   Chloride 96 (L) 98 - 111 mmol/L   CO2 24 22 - 32 mmol/L   Glucose, Bld 481 (H) 70 - 99 mg/dL   BUN 16 6 - 20 mg/dL   Creatinine, Ser 7.821.14 0.61 - 1.24 mg/dL   Calcium 9.2 8.9 - 95.610.3 mg/dL   Total Protein 7.0 6.5 - 8.1 g/dL   Albumin 3.7 3.5 - 5.0 g/dL  AST 37 15 - 41 U/L   ALT 45 (H) 0 - 44 U/L   Alkaline Phosphatase 107 38 - 126 U/L   Total Bilirubin 1.4 (H) 0.3 - 1.2 mg/dL   GFR calc non Af Amer >60 >60 mL/min   GFR calc Af Amer >60 >60 mL/min   Anion gap 11 5 - 15  CBG monitoring, ED  Result Value Ref Range   Glucose-Capillary 454 (H) 70 - 99 mg/dL  CBG monitoring, ED  Result Value Ref Range   Glucose-Capillary 477 (H) 70 - 99 mg/dL  POC CBG, ED  Result Value Ref Range   Glucose-Capillary 446 (H) 70 - 99 mg/dL   Comment 1 Notify RN    Comment 2 Document in Chart   CBG monitoring, ED  Result Value Ref Range   Glucose-Capillary 495 (H) 70 - 99 mg/dL   Comment 1 Notify RN    Comment 2 Document in Chart   CBG monitoring, ED  Result Value Ref Range   Glucose-Capillary 369 (H) 70 - 99 mg/dL   No results found.   ED Course/Procedures     Procedures  MDM  Patient with improvement in CBG to 369 after second dose of insulin and IV fluids.  Will discharge with metformin and instructions to follow-up closely with PCP.  Discussed low-carb diet and good oral hydration.  Patient is agreeable to plan and safe for discharge at this time.  Discussed results,  findings, treatment and follow up. Patient advised of return precautions. Patient verbalized understanding and agreed with plan.        Skylor Hughson, [place] N, PA-C 08/17/18 1503    Little, Ambrose Finland, MD 08/17/18 1540

## 2018-08-18 NOTE — Progress Notes (Signed)
This encounter was created in error - please disregard.

## 2018-09-07 ENCOUNTER — Telehealth: Payer: Self-pay | Admitting: Family Medicine

## 2018-09-07 DIAGNOSIS — E119 Type 2 diabetes mellitus without complications: Secondary | ICD-10-CM

## 2018-09-07 NOTE — Telephone Encounter (Signed)
Patients call taken.  Patient identified by name and date of birth.  Patient states he was in the ED earlier in the month for hyperglycemia.  Patient last had blood sugar taken at the hospital.  Patient checked his sugar today on another meter and it registered "500 and something."  Patient states he has been taking his metformin which is about to run out but has not taken glipizide.  Patient advised to go to the ED due to uncontrolled blood sugars.    Patient asked for glucometer.  Patient scheduled an appointment for Monday September 12, 2018.  Patient acknowledged understanding of advice.

## 2018-09-07 NOTE — Telephone Encounter (Signed)
Pt called saying his blood sugar was really high in the 500 please follow up

## 2018-09-09 ENCOUNTER — Other Ambulatory Visit: Payer: Self-pay | Admitting: Family Medicine

## 2018-09-09 DIAGNOSIS — E1165 Type 2 diabetes mellitus with hyperglycemia: Secondary | ICD-10-CM

## 2018-09-09 MED ORDER — GLUCOSE BLOOD VI STRP: ORAL_STRIP | 11 refills | Status: AC

## 2018-09-09 MED ORDER — ONETOUCH DELICA LANCETS 33G MISC: 11 refills | Status: AC

## 2018-09-09 MED ORDER — ONETOUCH VERIO W/DEVICE KIT: PACK | 0 refills | Status: DC

## 2018-09-09 MED ORDER — BLOOD GLUCOSE MONITOR KIT: PACK | 0 refills | Status: AC

## 2018-09-09 NOTE — Telephone Encounter (Signed)
I printed a RX for glucometer and supplies for patient. Can you get this off of the printer and see if pharmacy can fill or see if Franky Macho can sign in my absence and contact patient to pick up supplies/glucometer

## 2018-09-09 NOTE — Addendum Note (Signed)
Addended by: Lois Huxley, Jeannett Senior L on: 09/09/2018 10:19 AM   Modules accepted: Orders

## 2018-09-09 NOTE — Progress Notes (Signed)
Patient ID: Scott Livingston, male   DOB: [birth year], 48 y.o.   MRN: [id number]   Order printed for glucometer and supplies that patient can pick up at this pharmacy

## 2018-09-09 NOTE — Telephone Encounter (Signed)
Patient's insurance will cover [employer] system. Rx's sent to our pharmacy. Contacted the patient to inform him that we can mail these or they can be picked up today. Pt did not answer; left HIPAA compliant VM informing him of this. Left instructions to call me or Thelma Barge back with any questions.

## 2018-09-12 ENCOUNTER — Ambulatory Visit: Payer: PRIVATE HEALTH INSURANCE | Admitting: Family Medicine

## 2018-11-23 ENCOUNTER — Other Ambulatory Visit (HOSPITAL_COMMUNITY): Payer: PRIVATE HEALTH INSURANCE

## 2019-01-05 ENCOUNTER — Telehealth (HOSPITAL_COMMUNITY): Payer: Self-pay

## 2019-01-05 NOTE — Telephone Encounter (Signed)
New message    Just an FYI. We have made several attempts to contact this patient including sending a letter to schedule or reschedule their echocardiogram. We will be removing the patient from the echo WQ.   9.23.20 @ 4:20pm lm on home vm Marsha Hillman  9.11.20 mail reminder letter Pollie Poma   8.12.20 no show

## 2021-05-18 ENCOUNTER — Emergency Department (HOSPITAL_COMMUNITY): Payer: PRIVATE HEALTH INSURANCE

## 2021-05-18 ENCOUNTER — Encounter (HOSPITAL_COMMUNITY): Payer: Self-pay | Admitting: Emergency Medicine

## 2021-05-18 ENCOUNTER — Emergency Department (HOSPITAL_COMMUNITY)
Admission: EM | Admit: 2021-05-18 | Discharge: 2021-05-18 | Disposition: A | Discharge status: Home / Self Care | Payer: PRIVATE HEALTH INSURANCE | Attending: Emergency Medicine | Admitting: Emergency Medicine

## 2021-05-18 ENCOUNTER — Other Ambulatory Visit: Payer: Self-pay

## 2021-05-18 DIAGNOSIS — M542 Cervicalgia: Secondary | ICD-10-CM

## 2021-05-18 DIAGNOSIS — M62838 Other muscle spasm: Secondary | ICD-10-CM | POA: Insufficient documentation

## 2021-05-18 DIAGNOSIS — Y92410 Unspecified street and highway as the place of occurrence of the external cause: Secondary | ICD-10-CM | POA: Insufficient documentation

## 2021-05-18 DIAGNOSIS — M436 Torticollis: Secondary | ICD-10-CM | POA: Diagnosis not present

## 2021-05-18 DIAGNOSIS — R519 Headache, unspecified: Secondary | ICD-10-CM | POA: Diagnosis not present

## 2021-05-18 DIAGNOSIS — S0990XA Unspecified injury of head, initial encounter: Secondary | ICD-10-CM

## 2021-05-18 DIAGNOSIS — M25512 Pain in left shoulder: Secondary | ICD-10-CM

## 2021-05-18 MED ORDER — METHOCARBAMOL 500 MG PO TABS
500.0000 mg | ORAL_TABLET | Freq: Once | ORAL | Status: AC
  Administered 2021-05-18: 500 mg via ORAL
  Filled 2021-05-18: qty 1

## 2021-05-18 MED ORDER — ACETAMINOPHEN 500 MG PO TABS
1000.0000 mg | ORAL_TABLET | Freq: Once | ORAL | Status: AC
  Administered 2021-05-18: 1000 mg via ORAL
  Filled 2021-05-18: qty 2

## 2021-05-18 MED ORDER — METHOCARBAMOL 500 MG PO TABS: 500.0000 mg | ORAL_TABLET | Freq: Two times a day (BID) | ORAL | 0 refills | Status: AC
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
## 2021-05-18 NOTE — Discharge Instructions (Signed)
The CT scan of your head and neck were without any acute abnormalities.  As we discussed if you have any numbness or weakness in your hands or loss of dexterity you should return to the emergency room for reevaluation.  Otherwise it seems that your symptoms are quite consistent with a muscular injury.  Recommend warm compresses, Tylenol ibuprofen as discussed below muscle relaxer I prescribed you and follow-up with your primary care.  Please use Tylenol or ibuprofen for pain.  You may use 600 mg ibuprofen every 6 hours or 1000 mg of Tylenol every 6 hours.  You may choose to alternate between the 2.  This would be most effective.  Not to exceed 4 g of Tylenol within 24 hours.  Not to exceed 3200 mg ibuprofen 24 hours.   Your examination today is most concerning for a muscular injury 1. Medications: alternate ibuprofen and tylenol for pain control, take all usual home medications as they are prescribed 2. Treatment: rest, ice, elevate and use an ACE wrap or other compressive therapy to decrease swelling. Also drink plenty of fluids and do plenty of gentle stretching and move the affected muscle through its normal range of motion to prevent stiffness. 3. Follow Up: If your symptoms do not improve please follow up with orthopedics/sports medicine or your PCP for discussion of your diagnoses and further evaluation after today's visit; if you do not have a primary care doctor use the resource guide provided to find one; Please return to the ER for worsening symptoms or other concerns.

## 2021-05-18 NOTE — ED Notes (Signed)
Discharge instructions reviewed with patient. Patient verbalized understanding of instructions. Follow-up care and medications were reviewed. Patient ambulatory with steady gait. VSS upon discharge.  ?
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
## 2021-05-21 ENCOUNTER — Ambulatory Visit: Payer: Self-pay | Admitting: Family Medicine

## 2021-11-08 ENCOUNTER — Other Ambulatory Visit: Payer: Self-pay

## 2021-11-08 ENCOUNTER — Emergency Department (HOSPITAL_COMMUNITY): Payer: PRIVATE HEALTH INSURANCE

## 2021-11-08 ENCOUNTER — Encounter (HOSPITAL_COMMUNITY): Payer: Self-pay

## 2021-11-08 ENCOUNTER — Emergency Department (HOSPITAL_COMMUNITY)
Admission: EM | Admit: 2021-11-08 | Discharge: 2021-11-08 | Disposition: A | Discharge status: Home / Self Care | Payer: PRIVATE HEALTH INSURANCE | Attending: Emergency Medicine | Admitting: Emergency Medicine

## 2021-11-08 DIAGNOSIS — S20212A Contusion of left front wall of thorax, initial encounter: Secondary | ICD-10-CM | POA: Diagnosis not present

## 2021-11-08 DIAGNOSIS — R109 Unspecified abdominal pain: Secondary | ICD-10-CM | POA: Diagnosis not present

## 2021-11-08 DIAGNOSIS — S299XXA Unspecified injury of thorax, initial encounter: Secondary | ICD-10-CM | POA: Diagnosis present

## 2021-11-08 DIAGNOSIS — W19XXXA Unspecified fall, initial encounter: Secondary | ICD-10-CM | POA: Insufficient documentation

## 2021-11-08 DIAGNOSIS — M25551 Pain in right hip: Secondary | ICD-10-CM | POA: Diagnosis not present

## 2021-11-08 DIAGNOSIS — Z79899 Other long term (current) drug therapy: Secondary | ICD-10-CM | POA: Insufficient documentation

## 2021-11-08 DIAGNOSIS — S46012A Strain of muscle(s) and tendon(s) of the rotator cuff of left shoulder, initial encounter: Secondary | ICD-10-CM | POA: Insufficient documentation

## 2021-11-08 DIAGNOSIS — S46912A Strain of unspecified muscle, fascia and tendon at shoulder and upper arm level, left arm, initial encounter: Secondary | ICD-10-CM

## 2021-11-08 MED ORDER — ACETAMINOPHEN 500 MG PO TABS
1000.0000 mg | ORAL_TABLET | Freq: Once | ORAL | Status: AC
  Administered 2021-11-08: 1000 mg via ORAL
  Filled 2021-11-08: qty 2

## 2021-11-08 MED ORDER — KETOROLAC TROMETHAMINE 30 MG/ML IJ SOLN
30.0000 mg | Freq: Once | INTRAMUSCULAR | Status: AC
  Administered 2021-11-08: 30 mg via INTRAVENOUS
  Filled 2021-11-08: qty 1

## 2021-11-08 NOTE — ED Provider Triage Note (Signed)
Emergency Medicine Provider Triage Evaluation Note  Scott Livingston , a 51 y.o. male  was evaluated in triage.  Pt complains of fall today. The patient reports that he fell in a grease drain earlier. He complains of back pain, chest pain with inhalation and diffuse RUE pain.  Review of Systems  Positive:  Negative:   Physical Exam  BP (!) 154/120 (BP Location: Right Arm)   Pulse 79   Temp 98.5 F (36.9 C) (Oral)   Resp 20   SpO2 92%  Gen:   Awake, no distress   Resp:  Normal effort  MSK:   Moves extremities without difficulty  Other:  Unable to assess back given patient's pain and limited mobility. Compartments are soft. Pulses intact.   Medical Decision Making  Medically screening exam initiated at 2:05 PM.  Appropriate orders placed.  Scott Livingston was informed that the remainder of the evaluation will be completed by another provider, this initial triage assessment does not replace that evaluation, and the importance of remaining in the ED until their evaluation is complete.  Discussed with my attending. Will order Cxr and allow further imaging to be ordered once the patient has been further evaluated.    Achille Rich, PA-C 11/08/21 1407

## 2021-11-08 NOTE — ED Triage Notes (Signed)
Pt reports slipping on grease and falling into a hole where they put old grease at work. Pt now endorses left side pain, left arm pain, and right leg pain.
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
## 2021-11-08 NOTE — ED Notes (Signed)
Patient stood up and almost passed out and became diaphoretic.

## 2021-11-08 NOTE — Discharge Instructions (Signed)
If you develop new or worsening pain, trouble breathing, or any other new/concerning symptoms then return to the ER for evaluation.  You may take ibuprofen and Tylenol for pain.  Be sure to apply ice to the sore areas.

## 2022-06-16 ENCOUNTER — Emergency Department (HOSPITAL_BASED_OUTPATIENT_CLINIC_OR_DEPARTMENT_OTHER)
Admission: EM | Admit: 2022-06-16 | Discharge: 2022-06-16 | Disposition: A | Discharge status: Home / Self Care | Payer: BC Managed Care – PPO | Attending: Emergency Medicine | Admitting: Emergency Medicine

## 2022-06-16 ENCOUNTER — Encounter (HOSPITAL_BASED_OUTPATIENT_CLINIC_OR_DEPARTMENT_OTHER): Payer: Self-pay | Admitting: Emergency Medicine

## 2022-06-16 ENCOUNTER — Other Ambulatory Visit: Payer: Self-pay

## 2022-06-16 DIAGNOSIS — I509 Heart failure, unspecified: Secondary | ICD-10-CM | POA: Insufficient documentation

## 2022-06-16 DIAGNOSIS — Z79899 Other long term (current) drug therapy: Secondary | ICD-10-CM | POA: Insufficient documentation

## 2022-06-16 DIAGNOSIS — I110 Hypertensive heart disease with heart failure: Secondary | ICD-10-CM | POA: Diagnosis not present

## 2022-06-16 DIAGNOSIS — R21 Rash and other nonspecific skin eruption: Secondary | ICD-10-CM | POA: Diagnosis present

## 2022-06-16 MED ORDER — TRIAMCINOLONE ACETONIDE 0.1 % EX OINT: 1.0000 | TOPICAL_OINTMENT | Freq: Two times a day (BID) | CUTANEOUS | 0 refills | Status: AC

## 2022-06-16 NOTE — ED Notes (Signed)
Discharge paperwork given and verbally understood. 

## 2022-06-16 NOTE — ED Triage Notes (Signed)
Pt reports rash on his hands. Denies fevers at home. Pt also reporting diarrhea on Saturday that lasted for 2 days. Pt stating his work is making him get "checked out" for the diarrhea.

## 2022-06-16 NOTE — ED Provider Notes (Signed)
[facility] Provider Note   CSN: [id number] Arrival date & time: 06/16/22  [id number]     History  Chief Complaint  Patient presents with   Rash   Diarrhea    Scott Livingston is a 52 y.o. male with PMHx of CHF, obesity, hypertensive cardiomyopathy, class 3 obesity who presents to the ED for evaluation of rash and for work note.  Scott Livingston presents today primarily for a note to return back to work.  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  He has been unable to work since he started having diarrhea on Saturday.  Reports he was having a loose bowel movement every chart 20 to 30 minutes.  He had no associated abdominal pain, nausea, vomiting, fevers with this.  He states he has not had a loose stool since yesterday around 3 to 4 PM.  He did have a formed stool this morning.  His appetite is back to normal, he feels hungry.  He lives with his wife and his child who have not had any similar symptoms.  He has been taking Pepto for his symptoms.  He states that he requires a note to return back to work. No recent travel, antibiotics.   Additionally, patient mentions that about 2 and half weeks ago he started to develop a rash to his right hand.  Started off as bumps, now it is more red and spreading to his left hand.  It is not painful only itchy.  Not on any other part of his body.  He bought an over-the-counter cream that he is unable to name but does not seem to be helping.  He denies using any new soaps, detergents.  He has not done any yard work. No new exposures he can think of. He does wear gloves at work but has been working there since December and doesn't recall using new gloves.        Home Medications Prior to Admission medications   Medication Sig Start Date End Date Taking? Authorizing Provider  triamcinolone ointment (KENALOG) 0.1 % Apply 1 Application topically 2 (two) times daily. Apply to both hands twice a day for 7 days. 06/16/22   Yes Zaiah Credeur, Dawson Bills, DO  amLODipine (NORVASC) 10 MG tablet Take 1 tablet (10 mg total) by mouth daily. 08/10/18   Imogene Burn, PA-C  atorvastatin (LIPITOR) 20 MG tablet Take 1 tablet (20 mg total) by mouth daily. 08/10/18   Imogene Burn, PA-C  blood glucose meter kit and supplies KIT Use 3 times daily to monitor blood sugars 09/09/18   Fulp, Cammie, MD  carvedilol (COREG) 25 MG tablet Take 1 tablet (25 mg total) by mouth 2 (two) times daily. 08/10/18   Imogene Burn, PA-C  glipiZIDE (GLUCOTROL) 10 MG tablet Take 10 mg by mouth daily before breakfast.    [provider]  glucose blood (ONETOUCH VERIO) test strip Use to check blood sugar 3 times daily. E11.9 09/09/18   Fulp, Cammie, MD  hydrochlorothiazide (HYDRODIURIL) 25 MG tablet Take 1 tablet (25 mg total) by mouth daily. 08/10/18   Imogene Burn, PA-C  lisinopril (ZESTRIL) 40 MG tablet Take 1 tablet (40 mg total) by mouth daily. 08/10/18   Imogene Burn, PA-C  metFORMIN (GLUCOPHAGE) 500 MG tablet Take 1 tablet (500 mg total) by mouth 2 (two) times daily with a meal for 30 days. 08/17/18 09/16/18  Robinson, [place] N, PA-C  methocarbamol (ROBAXIN) 500 MG  tablet Take 1 tablet (500 mg total) by mouth 2 (two) times daily. 05/18/21   Tedd Sias, PA  Multiple Vitamin (MULTIVITAMIN) LIQD Take 5 mLs by mouth daily.    [provider]  OneTouch Delica Lancets 99991111 MISC Use to check blood sugar 3 times daily. E11.9 09/09/18   Antony Blackbird, MD      Allergies    Patient has no known allergies.    Review of Systems   Review of Systems  Constitutional:  Negative for fever.  Respiratory:  Negative for shortness of breath.   Cardiovascular:  Negative for chest pain.  Gastrointestinal:  Negative for abdominal pain, blood in stool, constipation, diarrhea and nausea.  Skin:  Positive for rash.    Physical Exam Updated Vital Signs BP (!) 139/103 (BP Location: Right Arm)   Pulse 80   Temp 98 F (36.7 C) (Oral)   Resp 18    SpO2 99%  Physical Exam Constitutional:      Appearance: Normal appearance. He is obese. He is not ill-appearing.  HENT:     Head: Normocephalic.     Nose: Nose normal.  Cardiovascular:     Rate and Rhythm: Normal rate and regular rhythm.  Pulmonary:     Effort: Pulmonary effort is normal. No respiratory distress.     Breath sounds: Normal breath sounds. No wheezing or rhonchi.  Abdominal:     General: Bowel sounds are normal. There is no distension.     Palpations: Abdomen is soft. There is no mass.     Tenderness: There is no abdominal tenderness. There is no guarding or rebound.  Skin:    General: Skin is warm and dry.     Capillary Refill: Capillary refill takes less than 2 seconds.     Comments: Erythematous and raised rash to right hand. Left hand with scattered erythematous papules. (See photo below). Not oozing or bleeding.  Neurological:     Mental Status: He is alert.        ED Results / Procedures / Treatments   Labs (all labs ordered are listed, but only abnormal results are displayed) Labs Reviewed - No data to display  EKG None  Radiology No results found.  Procedures Procedures    Medications Ordered in ED Medications - No data to display  ED Course/ Medical Decision Making/ A&P                             Medical Decision Making 9:18 AM 52 y/o M with PMHx significant for hypertensive cardiomyopathy, CHF, obesity, hypertension, class III obesity presents for work note after resolution of GI illness and subacute rash to bilateral hands. Vitals notable for HTN but he is asymptomatic- no signs or symptoms concerning for CHF exacerbation. He seems to have cleared GI bug as appetite is back to baseline and without fevers, N/V, abdominal ttp, and a formed stool this morning I think he should be stable to return to work tomorrow (after 24 hours). Will send steroid ointment for hands- if this worsens rash will advise him to use anti-fungal.   Recommend  f/u with PCP for HTN as BP elevated here but asymptomatic.           Final Clinical Impression(s) / ED Diagnoses Final diagnoses:  Rash of both hands    Rx / DC Orders ED Discharge Orders          Ordered    triamcinolone  ointment (KENALOG) 0.1 %  2 times daily        06/16/22 [place], [place], DO 06/16/22 [id number]    Blanchie Dessert, MD 06/16/22 [phone]

## 2022-06-16 NOTE — Discharge Instructions (Addendum)
I have written a note so that you can return to work tomorrow, 06/17/2022.   I have sent in a steroid ointment to [facility] on [address] for you to use on your hands. Avoid scratching the area as best as possible as it can spread.   If the ointment seems to be making the rash worse, you can switch to clotrimazole cream.   Your blood pressure was elevated today. I would encourage you to follow up with your primary care physician for this.
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
